# Patient Record
Sex: Female | Born: 1990 | Race: White | Hispanic: No | Marital: Married | Smoking: Current every day smoker
Health system: Southern US, Community
[De-identification: ages and names within clinical notes are randomized; demographics above are authoritative.]

## PROBLEM LIST (undated history)

## (undated) DIAGNOSIS — S060X9A Concussion with loss of consciousness of unspecified duration, initial encounter: Secondary | ICD-10-CM

## (undated) DIAGNOSIS — F329 Major depressive disorder, single episode, unspecified: Secondary | ICD-10-CM

## (undated) DIAGNOSIS — F32A Depression, unspecified: Secondary | ICD-10-CM

## (undated) DIAGNOSIS — K589 Irritable bowel syndrome without diarrhea: Secondary | ICD-10-CM

## (undated) DIAGNOSIS — J45909 Unspecified asthma, uncomplicated: Secondary | ICD-10-CM

## (undated) DIAGNOSIS — F319 Bipolar disorder, unspecified: Secondary | ICD-10-CM

## (undated) DIAGNOSIS — G43909 Migraine, unspecified, not intractable, without status migrainosus: Secondary | ICD-10-CM

## (undated) HISTORY — DX: Bipolar disorder, unspecified: F31.9

## (undated) HISTORY — PX: WISDOM TOOTH EXTRACTION: SHX21

## (undated) HISTORY — DX: Major depressive disorder, single episode, unspecified: F32.9

## (undated) HISTORY — DX: Unspecified asthma, uncomplicated: J45.909

## (undated) HISTORY — DX: Concussion with loss of consciousness of unspecified duration, initial encounter: S06.0X9A

## (undated) HISTORY — PX: TYMPANOSTOMY TUBE PLACEMENT: SHX32

## (undated) HISTORY — DX: Migraine, unspecified, not intractable, without status migrainosus: G43.909

## (undated) HISTORY — DX: Irritable bowel syndrome, unspecified: K58.9

## (undated) HISTORY — DX: Depression, unspecified: F32.A

## (undated) HISTORY — PX: APPENDECTOMY: SHX54

---
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
## 2010-09-08 ENCOUNTER — Inpatient Hospital Stay (INDEPENDENT_AMBULATORY_CARE_PROVIDER_SITE_OTHER)
Admission: RE | Admit: 2010-09-08 | Discharge: 2010-09-08 | Disposition: A | Discharge status: Home / Self Care | Payer: Worker's Compensation | Source: Ambulatory Visit | Attending: Emergency Medicine | Admitting: Emergency Medicine

## 2010-09-08 ENCOUNTER — Ambulatory Visit (INDEPENDENT_AMBULATORY_CARE_PROVIDER_SITE_OTHER): Payer: Worker's Compensation

## 2010-09-08 DIAGNOSIS — T148XXA Other injury of unspecified body region, initial encounter: Secondary | ICD-10-CM

## 2010-09-08 DIAGNOSIS — S6000XA Contusion of unspecified finger without damage to nail, initial encounter: Secondary | ICD-10-CM

## 2010-10-04 NOTE — Op Note (Signed)
[place]  Patient:    Emily Chavez, Emily Chavez Visit Number: [id number] MRN: [id number]          Service Type: PED Location: PEDS 6122 01 Attending Physician:  Leonia Corona Dictated by:   Judie Petit. Leonia Corona, M.D. Proc. Date: 08/03/01 Admit Date:  08/03/2001   CC:         [name], M.D.   Operative Report  PREOPERATIVE DIAGNOSIS:  Acute appendicitis, possible perforation.  POSTOPERATIVE DIAGNOSIS:  Acute perforated appendicitis.  OPERATION PERFORMED:  Open appendectomy and peritoneal lavage.  SURGEON:  Nelida Meuse, M.D.  ANESTHESIA:  General endotracheal.  ASSISTANT:  Nurse.  ESCRIPTION OF PROCEDURE:  The patient was brought to the operating room and placed supine on the operating table.  General endotracheal tube anesthesia was given.  The right lower quadrant of the abdominal wall and the surrounding area skin prepped and draped in the usual manner.  A McBurney incision centered at McBurneys point taken in curvilinear fashion along the skin crease measuring about 4 cm.  The incision was deepened through the subcutaneous tissues using the electrocautery until the external oblique aponeurosis was exposed which was incised in the line of its fibers and opened with scissors along the length of the incision.  The internal oblique muscle fibers were ____________ with a blunted hemostat and splayed with the help of blades of army-navy retractors.  The transverse abdominis muscles were also split along its fibers and split and stretched with blades of army-navy retractors.  The peritoneum was now visualized and held up with two hemostats and incised in between with scissors.  Immediately upon opening the peritoneum, serosanguinous fluid exuded which was suctioned out completely and then the peritoneal opening was increased with the help of scissors.  The blades of the retractor were inserted in the peritoneal cavity and  the underlying mass of bowel and omentum was clearly visible which was then felt with the right index finger.  The right index finger was slipped around the mass along the lateral wall of the abdomen where the mass was densely adheretn and upon blunt dissection we were able to separate the mass from the lateral wall where the appendix was stuck at the point of perforation.  A patch of gangrenous wall was visible around that mini perforation.  We continued blunt dissection with finger isolating the appendix which was coiled on itself and glued together with the terminal ileum and the cecal wall due to minute leak on that side.  After separating the tip of the appendix which was severely edematous and fragile, we continued dissecting the appendix toward the base.  This was a very long appendix with a gangrenous patch with microperforation in the center of the appendix.  The complete dissection of the appendix was done.  The edematous mesentery and the appendiceal branches of the vessel were ligated between two clamps and divided until the base of the appendix was cleared while the cecum was still in the peritoneal cavity. After complete dissection of the appendix, we were able to mobilize the cecal wall with the help of finger dissection and it was partly delivered into teh wound.  The base was then inspected carefully which was edematous and very wide.  It was crushed with a clamp and ligated using 2-0 Vicryl.  The base was ligated with 2-0 Vicryl and the appendix was clamped above the ligature and divided and removed from the field.  A pursestring suture in the cecal wall  around the appendiceal stump was made using 3-0 silk and the base was inverted by tightening the pursestring suture and ligating.  The appendix was returned back into the peritoneal cavity.  Local irrigation with a copious amount of warm saline was done until the returning fluid was completely clear.  The abdomen was now  closed in multiple layers.  The peritoneum was closed using 2-0 Vicryl running stitch.  The internal oblique and tranversus abdominis muscle were approximated in one layer using two interrupted sutures with 2-0 Vicryl.  The wound was once again irrigated and then skin was closed loosely using 4-0 nylon simple stitch.  Steri-Strips were applied which was covered with sterile gauze and Tegaderm dressing.  Approximately 10 cc of 0.25% Marcaine with epinephrine was infiltrated around the incisions for postoperative pain control.  The patient tolerated the procedure well which was smooth and uneventful.  The patient was later extubated and transported to the recovery room in good and stable condition. Dictated by:   Judie Petit. Leonia Corona, M.D. Attending Physician:  Leonia Corona DD:  08/04/01 TD:  08/05/01 Job: [id number]

## 2010-10-04 NOTE — Discharge Summary (Signed)
[place]  Patient:    Emily Chavez, Emily Chavez Visit Number: [id number] MRN: [id number]          Service Type: PED Location: PEDS [id number] Attending Physician:  Leonia Corona Dictated by:   Emelda Fear, M.D. Admit Date:  08/03/2001 Discharge Date: 08/06/2001                             Discharge Summary  DATE OF BIRTH:  Oct 15, 1990.  PROCEDURE:  Appendectomy performed by Dr. Leeanne Mannan.  CONSULTATIONS:  None.  DISCHARGE DIAGNOSES: 1. Gangrenous appendicitis with perforation. 2. Status post appendectomy.  DISCHARGE MEDICATIONS: 1. Augmentin 875 mg one tablet p.o. b.i.d. x10 days. 2. Tylenol, administer age-appropriate dose q.4h. p.r.n. pain.  FOLLOW-UP:  Family to call Dr. Leeanne Mannan for follow-up within the next seven to 10 days.  HOSPITAL COURSE:  Anaiza is a 20 year old Caucasian female presenting secondary to abdominal pain and vomiting for one day.  CT performed revealed inflammation surrounding the cecum, concerning for appendicitis.  Dr. Leeanne Mannan performed appendectomy on patient with a gangrenous appendicitis with perforation revealed during operation.  The patient was initiated on Unasyn and Clindamycin therapy postoperatively and did well throughout hospitalization.  The patient was ambulating well and tolerating p.o. without difficulty on day of discharge.  The patient remained afebrile throughout hospitalization.  The patient was discharged postop day #3 in stable condition.  DISCHARGE LABORATORY DATA:  White blood cells are 6.4, hemoglobin 11.5, hematocrit 33.7, platelets 236, neutrophils 49%, ANC of 3.1.  Sodium 139, potassium 3.4, chloride 104, bicarbonate 27, BUN 8, creatinine 0.5, glucose 96, and calcium of 9.1.  DISCHARGE INSTRUCTIONS:  Activity:  No heavy lifting or significant physical activity for the next four weeks.  Diet:  No restrictions.  Wound care:  Per recommendations of Dr. Leeanne Mannan.  SPECIAL  INSTRUCTIONS:  The patient is recommended to call Dr. Rosalee Kaufman office for the development of fever, vomiting, or severe abdominal pain in the upcoming week. Dictated by:   Emelda Fear, M.D. Attending Physician:  Leonia Corona DD:  08/06/01 TD:  08/08/01 Job: [id number]

## 2011-02-04 DIAGNOSIS — F3175 Bipolar disorder, in partial remission, most recent episode depressed: Secondary | ICD-10-CM | POA: Insufficient documentation

## 2011-02-17 LAB — RAPID URINE DRUG SCREEN, HOSP PERFORMED
Amphetamines: NOT DETECTED
Barbiturates: NOT DETECTED
Benzodiazepines: NOT DETECTED
Cocaine: NOT DETECTED
Opiates: NOT DETECTED
Tetrahydrocannabinol: NOT DETECTED

## 2011-02-17 LAB — URINALYSIS, ROUTINE W REFLEX MICROSCOPIC
Bilirubin Urine: NEGATIVE
Glucose, UA: NEGATIVE
Hgb urine dipstick: NEGATIVE
Ketones, ur: NEGATIVE
Nitrite: NEGATIVE
Protein, ur: NEGATIVE
Specific Gravity, Urine: 1.018
Urobilinogen, UA: 0.2
pH: 7.5

## 2011-02-17 LAB — ETHANOL: Alcohol, Ethyl (B): <5

## 2011-02-17 LAB — POCT I-STAT, CHEM 8
BUN: 4 — ABNORMAL LOW
BUN: 7
Calcium, Ion: 0.79 — ABNORMAL LOW
Calcium, Ion: 1.12
Chloride: 107
Chloride: 115 — ABNORMAL HIGH
Creatinine, Ser: 0.7
Creatinine, Ser: 0.9
Glucose, Bld: 84
Glucose, Bld: 91
HCT: 38
HCT: 39
Hemoglobin: 12.9
Hemoglobin: 13.3
Potassium: 3.3 — ABNORMAL LOW
Potassium: 3.6
Sodium: 136
Sodium: 141
TCO2: 18
TCO2: 24

## 2011-02-17 LAB — PREGNANCY, URINE
Preg Test, Ur: NEGATIVE
Preg Test, Ur: NEGATIVE

## 2012-09-01 ENCOUNTER — Telehealth: Payer: Self-pay | Admitting: Obstetrics & Gynecology

## 2012-09-01 NOTE — Telephone Encounter (Signed)
Pt calling to see when the last time her blood sugar was checked was?/[place]

## 2012-09-01 NOTE — Telephone Encounter (Signed)
Patient notified that blood sugar has not been checked here.  Not a routine screening test at her age. Patient reports she learned in class that urinary frequency can be a sign of diabetes.  She reports urinary frequency for approx 1 year and 50 pound weight gain.  Denies any pain with urination.  Advised that we would recommend she be seen for evaluation and doctor to determine what lab work she may need.  Brief discussion that there are other ways to check blood sugar, HgbA1C and that may also need urine recheck.  Patient declines appointment, just wanted blood work.  States she has PCP appointment on Tuesday and will go there first and call back if needed.

## 2012-09-16 DIAGNOSIS — S060XAA Concussion with loss of consciousness status unknown, initial encounter: Secondary | ICD-10-CM

## 2012-09-16 DIAGNOSIS — S060X9A Concussion with loss of consciousness of unspecified duration, initial encounter: Secondary | ICD-10-CM

## 2012-09-16 HISTORY — DX: Concussion with loss of consciousness of unspecified duration, initial encounter: S06.0X9A

## 2012-09-16 HISTORY — DX: Concussion with loss of consciousness status unknown, initial encounter: S06.0XAA

## 2012-10-12 ENCOUNTER — Ambulatory Visit: Payer: Self-pay

## 2012-10-13 ENCOUNTER — Telehealth: Payer: Self-pay | Admitting: Gynecology

## 2012-10-13 NOTE — Telephone Encounter (Signed)
Pt would like to r/s depo appt from yesterday but not sure if she is still within her time frame to come in next week.

## 2012-10-14 ENCOUNTER — Ambulatory Visit (INDEPENDENT_AMBULATORY_CARE_PROVIDER_SITE_OTHER): Payer: Commercial Managed Care - PPO | Admitting: Obstetrics & Gynecology

## 2012-10-14 VITALS — BP 110/68 | Wt 200.0 lb

## 2012-10-14 DIAGNOSIS — Z3040 Encounter for surveillance of contraceptives, unspecified: Secondary | ICD-10-CM

## 2012-10-14 MED ORDER — MEDROXYPROGESTERONE ACETATE 150 MG/ML IM SUSP
150.0000 mg | Freq: Once | INTRAMUSCULAR | Status: AC
  Administered 2012-10-14: 150 mg via INTRAMUSCULAR

## 2012-10-14 NOTE — Telephone Encounter (Signed)
Spoke with pt who had last depo shot 07-26-12. Next shot due 5-26 thru 6-9. Pt requesting to come today because she has a ride. Appt sched for 1:00 today.

## 2012-10-14 NOTE — Progress Notes (Signed)
Patient tolerated injection of 150 mg Depo-Provera IM well knows to return in 3 months for follow up injection. 01/06/13

## 2013-01-03 ENCOUNTER — Emergency Department (HOSPITAL_COMMUNITY): Payer: Commercial Managed Care - PPO

## 2013-01-03 ENCOUNTER — Ambulatory Visit (INDEPENDENT_AMBULATORY_CARE_PROVIDER_SITE_OTHER): Payer: Commercial Managed Care - PPO | Admitting: *Deleted

## 2013-01-03 ENCOUNTER — Emergency Department (HOSPITAL_COMMUNITY)
Admission: EM | Admit: 2013-01-03 | Discharge: 2013-01-03 | Disposition: A | Discharge status: Home / Self Care | Payer: Commercial Managed Care - PPO | Attending: Emergency Medicine | Admitting: Emergency Medicine

## 2013-01-03 ENCOUNTER — Encounter (HOSPITAL_COMMUNITY): Payer: Self-pay | Admitting: Emergency Medicine

## 2013-01-03 VITALS — BP 110/68 | HR 82 | Resp 16 | Ht 64.0 in | Wt 191.0 lb

## 2013-01-03 DIAGNOSIS — X500XXA Overexertion from strenuous movement or load, initial encounter: Secondary | ICD-10-CM | POA: Insufficient documentation

## 2013-01-03 DIAGNOSIS — S298XXA Other specified injuries of thorax, initial encounter: Secondary | ICD-10-CM | POA: Insufficient documentation

## 2013-01-03 DIAGNOSIS — Y9389 Activity, other specified: Secondary | ICD-10-CM | POA: Insufficient documentation

## 2013-01-03 DIAGNOSIS — Y929 Unspecified place or not applicable: Secondary | ICD-10-CM | POA: Insufficient documentation

## 2013-01-03 DIAGNOSIS — R0789 Other chest pain: Secondary | ICD-10-CM

## 2013-01-03 DIAGNOSIS — Z79899 Other long term (current) drug therapy: Secondary | ICD-10-CM | POA: Insufficient documentation

## 2013-01-03 DIAGNOSIS — Z3040 Encounter for surveillance of contraceptives, unspecified: Secondary | ICD-10-CM

## 2013-01-03 MED ORDER — IBUPROFEN 800 MG PO TABS: 800.0000 mg | ORAL_TABLET | Freq: Three times a day (TID) | ORAL | Status: DC

## 2013-01-03 MED ORDER — MEDROXYPROGESTERONE ACETATE 150 MG/ML IM SUSP
150.0000 mg | Freq: Once | INTRAMUSCULAR | Status: AC
  Administered 2013-01-03: 150 mg via INTRAMUSCULAR

## 2013-01-03 NOTE — ED Provider Notes (Signed)
CSN: [id number]     Arrival date & time 01/03/13  2140 History  This chart was scribed for non-physician practitioner Magnus Sinning, PA-C, working with Joya Gaskins, MD by Shari Heritage, ED Scribe. This patient was seen in room TR11C/TR11C and the patient's care was started at 11:10 PM.    Chief Complaint  Patient presents with  . Chest Pain    The history is provided by the patient. No language interpreter was used.   HPI Comments: Emily Chavez is a 22 y.o. female who presents to the Emergency Department complaining of moderate, sternal chest pain that began this afternoon. She reports she was putting her hair back into a ponytail when she heard a loud pop and began experiencing pain. She reports pain is exacerbated by movement and deep breaths. She has not taken anything for pain prior to arrival.  She denies SOB. She has no pertinent medical history. She is a non-smoker.  History reviewed. No pertinent past medical history. History reviewed. No pertinent past surgical history. No family history on file. History  Substance Use Topics  . Smoking status: Never Smoker   . Smokeless tobacco: Not on file  . Alcohol Use: Yes   OB History   Grav Para Term Preterm Abortions TAB SAB Ect Mult Living                 Review of Systems A complete 10 system review of systems was obtained and all systems are negative except as noted in the HPI and PMH.   Allergies  Iodine  Home Medications   Current Outpatient Rx  Name  Route  Sig  Dispense  Refill  . Biotin 5000 MCG CAPS   Oral   Take 5,000 mcg by mouth daily.         . divalproex (DEPAKOTE) 500 MG DR tablet   Oral   Take 500-1,000 mg by mouth daily. Take 500mg  one day and 1,000 the next day.         . MedroxyPROGESTERone Acetate (DEPO-PROVERA IM)   Intramuscular   Inject 1 Applicatorful into the muscle every 3 (three) months.         . Soft Lens Products (VISINE FOR CONTACTS) SOLN   Both Eyes   Place 1-2 drops  into both eyes as needed (dry eyes).          Triage Vitals: BP 133/79  Pulse 104  Temp(Src) 98.4 F (36.9 C) (Oral)  Resp 18  SpO2 99%  Physical Exam  Nursing note and vitals reviewed. Constitutional: She is oriented to person, place, and time. She appears well-developed and well-nourished.  HENT:  Head: Normocephalic and atraumatic.  Eyes: EOM are normal.  Neck: Neck supple.  Cardiovascular: Normal rate, regular rhythm and normal heart sounds.   Pulmonary/Chest: Effort normal and breath sounds normal. No respiratory distress. She has no wheezes. She has no rales. She exhibits tenderness.  Musculoskeletal:  Tenderness to palpation of the sternum and the left anterior chest. Chest pain becomes worse with movement of the left arm. 2+ radial pulses bilaterally.   Neurological: She is alert and oriented to person, place, and time.  Skin: Skin is warm and dry.  Psychiatric: She has a normal mood and affect.    ED Course  DIAGNOSTIC STUDIES: Oxygen Saturation is 99% on room air, normal by my interpretation.    COORDINATION OF CARE: 11:18 PM- Patient presents to the ED complaining of chest pain. Discussed pain symptoms are likely  due to irritation of the costal cartilage. Will prescribe ibuprofen and advised patient to apply ice to treat swelling. Patient informed of current plan for treatment and evaluation and agrees with plan at this time.     Procedures (including critical care time)  Labs Reviewed - No data to display Dg Chest 2 View  01/03/2013   *RADIOLOGY REPORT*  Clinical Data: Chest pain and chest pressure.  CHEST - 2 VIEW  Comparison: None.  Findings: Slightly shallow inspiration. The heart size and pulmonary vascularity are normal. The lungs appear clear and expanded without focal air space disease or consolidation. No blunting of the costophrenic angles.  No pneumothorax.  Mediastinal contours appear intact.  IMPRESSION: No evidence of active pulmonary disease.    Original Report Authenticated By: Burman Nieves, M.D.   No diagnosis found.  MDM  Patient presenting with chest wall pain after feeling a pop in her chest while doing her hair.  CXR negative.  Patient stable for discharge.  Patient instructed to take NSAIDs.    I personally performed the services described in this documentation, which was scribed in my presence. The recorded information has been reviewed and is accurate.    Pascal Lux [place], PA-C 01/03/13 2348

## 2013-01-03 NOTE — Progress Notes (Signed)
Pt. Tolerated injection well is aware to return in 3 months for Next Depo

## 2013-01-03 NOTE — ED Notes (Signed)
PT. REPORTS PAIN AT MID CHEST WHEN MOVING HER ARMS AND TAKING A DEEP BREATH ONSET THIS EVENING WHILE FIXING HER HAIR AND ARCHED HER BACK " FELT A POP" , RESPIRATIONS UNLABORED .

## 2013-01-05 NOTE — ED Provider Notes (Signed)
Medical screening examination/treatment/procedure(s) were performed by non-physician practitioner and as supervising physician I was immediately available for consultation/collaboration.   Joya Gaskins, MD 01/05/13 1022

## 2013-03-22 ENCOUNTER — Ambulatory Visit: Payer: Commercial Managed Care - PPO

## 2013-03-25 ENCOUNTER — Ambulatory Visit (INDEPENDENT_AMBULATORY_CARE_PROVIDER_SITE_OTHER): Payer: Commercial Managed Care - PPO | Admitting: Gynecology

## 2013-03-25 ENCOUNTER — Encounter: Payer: Self-pay | Admitting: *Deleted

## 2013-03-25 VITALS — BP 100/68 | HR 72 | Resp 16 | Wt 205.0 lb

## 2013-03-25 DIAGNOSIS — Z3009 Encounter for other general counseling and advice on contraception: Secondary | ICD-10-CM

## 2013-03-25 DIAGNOSIS — Z3040 Encounter for surveillance of contraceptives, unspecified: Secondary | ICD-10-CM

## 2013-03-25 DIAGNOSIS — N644 Mastodynia: Secondary | ICD-10-CM

## 2013-03-25 LAB — POCT URINE PREGNANCY: Preg Test, Ur: NEGATIVE

## 2013-03-25 MED ORDER — MEDROXYPROGESTERONE ACETATE 150 MG/ML IM SUSP
150.0000 mg | Freq: Once | INTRAMUSCULAR | Status: AC
  Administered 2013-03-25: 150 mg via INTRAMUSCULAR

## 2013-03-25 NOTE — Addendum Note (Signed)
Addended by: Douglass Rivers on: 03/25/2013 11:59 AM   Modules accepted: Orders, Level of Service

## 2013-03-25 NOTE — Addendum Note (Signed)
Addended by: Lorraine Lax on: 03/25/2013 11:44 AM   Modules accepted: Orders, Medications

## 2013-03-25 NOTE — Patient Instructions (Signed)
Return for either repeat depo provera or iud by 04/04/13  Levonorgestrel intrauterine device (IUD) What is this medicine? LEVONORGESTREL IUD (LEE voe nor jes trel) is a contraceptive (birth control) device. The device is placed inside the uterus by a healthcare professional. It is used to prevent pregnancy and can also be used to treat heavy bleeding that occurs during your period. Depending on the device, it can be used for 3 to 5 years. This medicine may be used for other purposes; ask your health care provider or pharmacist if you have questions. COMMON BRAND NAME(S): Gretta Cool What should I tell my health care provider before I take this medicine? They need to know if you have any of these conditions: -abnormal Pap smear -cancer of the breast, uterus, or cervix -diabetes -endometritis -genital or pelvic infection now or in the past -have more than one sexual partner or your partner has more than one partner -heart disease -history of an ectopic or tubal pregnancy -immune system problems -IUD in place -liver disease or tumor -problems with blood clots or take blood-thinners -use intravenous drugs -uterus of unusual shape -vaginal bleeding that has not been explained -an unusual or allergic reaction to levonorgestrel, other hormones, silicone, or polyethylene, medicines, foods, dyes, or preservatives -pregnant or trying to get pregnant -breast-feeding How should I use this medicine? This device is placed inside the uterus by a health care professional. Talk to your pediatrician regarding the use of this medicine in children. Special care may be needed. Overdosage: If you think you have taken too much of this medicine contact a poison control center or emergency room at once. NOTE: This medicine is only for you. Do not share this medicine with others. What if I miss a dose? This does not apply. What may interact with this medicine? Do not take this medicine with any of the  following medications: -amprenavir -bosentan -fosamprenavir This medicine may also interact with the following medications: -aprepitant -barbiturate medicines for inducing sleep or treating seizures -bexarotene -griseofulvin -medicines to treat seizures like carbamazepine, ethotoin, felbamate, oxcarbazepine, phenytoin, topiramate -modafinil -pioglitazone -rifabutin -rifampin -rifapentine -some medicines to treat HIV infection like atazanavir, indinavir, lopinavir, nelfinavir, tipranavir, ritonavir -St. John's wort -warfarin This list may not describe all possible interactions. Give your health care provider a list of all the medicines, herbs, non-prescription drugs, or dietary supplements you use. Also tell them if you smoke, drink alcohol, or use illegal drugs. Some items may interact with your medicine. What should I watch for while using this medicine? Visit your doctor or health care professional for regular check ups. See your doctor if you or your partner has sexual contact with others, becomes HIV positive, or gets a sexual transmitted disease. This product does not protect you against HIV infection (AIDS) or other sexually transmitted diseases. You can check the placement of the IUD yourself by reaching up to the top of your vagina with clean fingers to feel the threads. Do not pull on the threads. It is a good habit to check placement after each menstrual period. Call your doctor right away if you feel more of the IUD than just the threads or if you cannot feel the threads at all. The IUD may come out by itself. You may become pregnant if the device comes out. If you notice that the IUD has come out use a backup birth control method like condoms and call your health care provider. Using tampons will not change the position of the IUD and are  okay to use during your period. What side effects may I notice from receiving this medicine? Side effects that you should report to your  doctor or health care professional as soon as possible: -allergic reactions like skin rash, itching or hives, swelling of the face, lips, or tongue -fever, flu-like symptoms -genital sores -high blood pressure -no menstrual period for 6 weeks during use -pain, swelling, warmth in the leg -pelvic pain or tenderness -severe or sudden headache -signs of pregnancy -stomach cramping -sudden shortness of breath -trouble with balance, talking, or walking -unusual vaginal bleeding, discharge -yellowing of the eyes or skin Side effects that usually do not require medical attention (report to your doctor or health care professional if they continue or are bothersome): -acne -breast pain -change in sex drive or performance -changes in weight -cramping, dizziness, or faintness while the device is being inserted -headache -irregular menstrual bleeding within first 3 to 6 months of use -nausea This list may not describe all possible side effects. Call your doctor for medical advice about side effects. You may report side effects to FDA at 1-800-FDA-1088. Where should I keep my medicine? This does not apply. NOTE: This sheet is a summary. It may not cover all possible information. If you have questions about this medicine, talk to your doctor, pharmacist, or health care provider.  2014, Elsevier/Gold Standard. (2011-06-05 13:54:04)

## 2013-03-25 NOTE — Progress Notes (Signed)
Subjective:     Patient ID: Emily Chavez, female   DOB: [birth year], 22 y.o.   MRN: [id number]  HPI Comments: Pt here for Depo injection, on for over 1y, now with no cycle except some occasional spotting .  Pt concerned because she took an antibiotic a few weeks ago for a sinus infection and was concerned that it would interfere with her contraception.  Pt reports some breast tenderness but she can get from time to time. Pt reports emotional on depo.  Pt was on ocp in past, but was noncompliant.  No condoms, current partner almost 2y. Pt also has concerns that she might PCOS.  She reports urinary frequency and was screened for diabetes but was negative, she also expresses concerns regarding future fertility and bone loss on depo    Review of Systems  Constitutional: Negative for fever and chills.  Gastrointestinal: Negative for abdominal pain.  Genitourinary: Positive for vaginal discharge (light, unchaged). Negative for vaginal bleeding and vaginal pain.       Objective:   Physical Exam  Constitutional: She is oriented to person, place, and time. She appears well-developed and well-nourished.  Genitourinary: Vagina normal and uterus normal. There is no rash or tenderness on the right labia. There is no rash or tenderness on the left labia. Uterus is not tender. Cervix exhibits no motion tenderness, no discharge and no friability. Right adnexum displays no mass. Left adnexum displays no mass.  Neurological: She is alert and oriented to person, place, and time.  Skin: Skin is warm and dry.       Assessment:     Contraceptive management     Plan:     Long discussion regarding mechanism of action of depo-provera and the side effects from the medication such as depression and weight gain as well as delay in full fertility but not infertility.  We discussed affects on bone density but also the return of bone mass after discontinuance We also reviewed alternatives such as skyla IUD and  nexplanon which are different progestins and may have a different affect on her.  Christean Grief shown to pt, she would like to check on insurance coverage before proceeding, in the meanwhile, she is still  Covered by the depo and injection was delayed today awaiting her final decision,  She is instructed to return for either by 11/17 Info on iud provided We also discussed PCOS and stressed that she does not have that diagnosis based on urinary frequency and testing for diabetes. Vaginal discharge consistent with progestin affect Questions were addressed 76m spent discussing above

## 2013-03-25 NOTE — Addendum Note (Signed)
Addended by: Ernest Haber on: 03/25/2013 10:58 AM   Modules accepted: Orders

## 2013-03-28 ENCOUNTER — Telehealth: Payer: Self-pay | Admitting: Gynecology

## 2013-03-28 NOTE — Telephone Encounter (Signed)
Pt calling to get insurance information for the skyla insertion. Says she would have to come in this week or get the depo shot.

## 2013-03-29 ENCOUNTER — Encounter: Payer: Self-pay | Admitting: Gynecology

## 2013-03-29 DIAGNOSIS — F319 Bipolar disorder, unspecified: Secondary | ICD-10-CM | POA: Insufficient documentation

## 2013-03-29 MED ORDER — MISOPROSTOL 200 MCG PO TABS: ORAL_TABLET | ORAL | Status: DC

## 2013-03-29 NOTE — Telephone Encounter (Signed)
Patient is scheduled for [facility] 11/12.   Take Cytotec 200 mcg tablet.  1 tablet night before the procedure.  1 tablet the morning of the procedure.  800 mg (Can purchase over the counter, you will need four 200 mg pills) 1 hour before appointment.  Take with food.  Be sure to eat and drink before appointment. Patient is agreeable and verbalizes understanding of pre-procedure instructions.

## 2013-03-30 ENCOUNTER — Ambulatory Visit (INDEPENDENT_AMBULATORY_CARE_PROVIDER_SITE_OTHER): Payer: Commercial Managed Care - PPO | Admitting: Gynecology

## 2013-03-30 VITALS — BP 118/80 | Resp 18 | Ht 64.0 in | Wt 201.0 lb

## 2013-03-30 DIAGNOSIS — Z30430 Encounter for insertion of intrauterine contraceptive device: Secondary | ICD-10-CM

## 2013-03-30 DIAGNOSIS — Z3040 Encounter for surveillance of contraceptives, unspecified: Secondary | ICD-10-CM

## 2013-03-30 HISTORY — PX: OTHER SURGICAL HISTORY: SHX169

## 2013-03-30 NOTE — Patient Instructions (Signed)
IUD PLACEMENT POST PROCEDURE INSTRUCTIONS  1. You may take Ibuprofen, Aleve or Tylenol for pain as needed.      Cramping should resolve within 24 hours.  2. You may have a small amount of spotting. You should wear a mini pad for the next few days  3. You may have intercourse after 24 hours. If you are using this for birth control, it is effective immediately.  4. You need to call if you have any pelvic pain, fever, heavy bleeding or foul smelling vaginal discharge. Irregular bleeding is common the first several months after having an IUD placed. You do not need to for this reason unless you are                     concerned.   5. Shower or bathe as normal  6. You should have a follow-up appointment in 4-8 weeks for a re-check to make sure you are not having any problems.  

## 2013-03-30 NOTE — Progress Notes (Signed)
22 yrs Single Caucasian female presents for  insertion of skyla. Denies any vaginal symptoms or STD concerns.  Pt is amenorrheic since being on DepoProvera, now transitioning to IUD.      Patient read information regarding IUD insertion.  All questions addressed.    Healthy female,time, place and personnormal menses, no abnormal bleeding, pelvic pain or discharge Abdomen: soft, non-tender Groinno inguinal nodes palpated  Pelvic exam: Vulva;normal  Vagina:normal vagina  Cervix:Non-tender, Negative CMT, no lesions or redness, nulliparous/parous os  Uterus:normal shape, position and consistency    Procedure:  Bimanual exam performed for orientation of uterus. Speculum inserted into vagina. Cervix visualized and cleansed with hibiclens solution X 3, xylocaine jelly placed.. Tenaculum placed on cervix at 12 o'clock position(s).  Uterus sounded to 8 centimeters.  IUD removed from sterile packet and under sterile conditions inserted to fundus of uterus.  Introducer removed without difficulty.  IUD string trimmed to 3 centimeters.  Remainder string given to patient to feel for identification.  Tenaculum removed.  No bleeding noted.  Speculum removed.  Uterus palpated normal.  Patient tolerated procedure well.   Pt requests paracervical block for cramping afterwards-10cc of 2%lidocaine/m0.25%marcaine placed   A: Insertion of Lot # [id number], Expiration date 1/17, Skyla   P:  Instructions and warnings signs given.       IUD identification card given with IUD removal 02/2016       Return visit 3m

## 2013-04-20 ENCOUNTER — Ambulatory Visit: Payer: Self-pay | Admitting: Obstetrics & Gynecology

## 2013-04-22 ENCOUNTER — Ambulatory Visit: Payer: Self-pay | Admitting: Obstetrics & Gynecology

## 2013-05-02 ENCOUNTER — Ambulatory Visit: Payer: Self-pay | Admitting: Gynecology

## 2013-05-05 ENCOUNTER — Encounter: Payer: Self-pay | Admitting: Certified Nurse Midwife

## 2013-05-05 ENCOUNTER — Ambulatory Visit (INDEPENDENT_AMBULATORY_CARE_PROVIDER_SITE_OTHER): Payer: Commercial Managed Care - PPO | Admitting: Certified Nurse Midwife

## 2013-05-05 VITALS — BP 102/64 | HR 68 | Resp 16 | Ht 63.75 in | Wt 209.0 lb

## 2013-05-05 DIAGNOSIS — Z0000 Encounter for general adult medical examination without abnormal findings: Secondary | ICD-10-CM

## 2013-05-05 DIAGNOSIS — R339 Retention of urine, unspecified: Secondary | ICD-10-CM

## 2013-05-05 DIAGNOSIS — Z01419 Encounter for gynecological examination (general) (routine) without abnormal findings: Secondary | ICD-10-CM

## 2013-05-05 LAB — POCT URINALYSIS DIPSTICK
Bilirubin, UA: NEGATIVE
Glucose, UA: NEGATIVE
Leukocytes, UA: NEGATIVE
Nitrite, UA: NEGATIVE
Protein, UA: NEGATIVE
Urobilinogen, UA: NEGATIVE
pH, UA: 5

## 2013-05-05 NOTE — Patient Instructions (Signed)
General topics  Next pap or exam is  due in 1 year Take a Women's multivitamin Take 1200 mg. of calcium daily - prefer dietary If any concerns in interim to call back  Breast Self-Awareness Practicing breast self-awareness may pick up problems early, prevent significant medical complications, and possibly save your life. By practicing breast self-awareness, you can become familiar with how your breasts look and feel and if your breasts are changing. This allows you to notice changes early. It can also offer you some reassurance that your breast health is good. One way to learn what is normal for your breasts and whether your breasts are changing is to do a breast self-exam. If you find a lump or something that was not present in the past, it is best to contact your caregiver right away. Other findings that should be evaluated by your caregiver include nipple discharge, especially if it is bloody; skin changes or reddening; areas where the skin seems to be pulled in (retracted); or new lumps and bumps. Breast pain is seldom associated with cancer (malignancy), but should also be evaluated by a caregiver. BREAST SELF-EXAM The best time to examine your breasts is 5 7 days after your menstrual period is over.  ExitCare Patient Information 2013 [employer].   Exercise to Stay Healthy Exercise helps you become and stay healthy. EXERCISE IDEAS AND TIPS Choose exercises that:  You enjoy.  Fit into your day. You do not need to exercise really hard to be healthy. You can do exercises at a slow or medium level and stay healthy. You can:  Stretch before and after working out.  Try yoga, Pilates, or tai chi.  Lift weights.  Walk fast, swim, jog, run, climb stairs, bicycle, dance, or rollerskate.  Take aerobic classes. Exercises that burn about 150 calories:  Running 1  miles in 15 minutes.  Playing volleyball for 45 to 60 minutes.  Washing and waxing a car for 45 to 60  minutes.  Playing touch football for 45 minutes.  Walking 1  miles in 35 minutes.  Pushing a stroller 1  miles in 30 minutes.  Playing basketball for 30 minutes.  Raking leaves for 30 minutes.  Bicycling 5 miles in 30 minutes.  Walking 2 miles in 30 minutes.  Dancing for 30 minutes.  Shoveling snow for 15 minutes.  Swimming laps for 20 minutes.  Walking up stairs for 15 minutes.  Bicycling 4 miles in 15 minutes.  Gardening for 30 to 45 minutes.  Jumping rope for 15 minutes.  Washing windows or floors for 45 to 60 minutes. Document Released: 06/07/2010 Document Revised: 07/28/2011 Document Reviewed: 06/07/2010 ExitCare Patient Information 2013 [employer].   Other topics ( that may be useful information):    Sexually Transmitted Disease Sexually transmitted disease (STD) refers to any infection that is passed from person to person during sexual activity. This may happen by way of saliva, semen, blood, vaginal mucus, or urine. Common STDs include:  Gonorrhea.  Chlamydia.  Syphilis.  HIV/AIDS.  Genital herpes.  Hepatitis B and C.  Trichomonas.  Human papillomavirus (HPV).  Pubic lice. CAUSES  An STD may be spread by bacteria, virus, or parasite. A person can get an STD by:  Sexual intercourse with an infected person.  Sharing sex toys with an infected person.  Sharing needles with an infected person.  Having intimate contact with the genitals, mouth, or rectal areas of an infected person. SYMPTOMS  Some people may not have any symptoms, but   they can still pass the infection to others. Different STDs have different symptoms. Symptoms include:  Painful or bloody urination.  Pain in the pelvis, abdomen, vagina, anus, throat, or eyes.  Skin rash, itching, irritation, growths, or sores (lesions). These usually occur in the genital or anal area.  Abnormal vaginal discharge.  Penile discharge in men.  Soft, flesh-colored skin growths in the  genital or anal area.  Fever.  Pain or bleeding during sexual intercourse.  Swollen glands in the groin area.  Yellow skin and eyes (jaundice). This is seen with hepatitis. DIAGNOSIS  To make a diagnosis, your caregiver may:  Take a medical history.  Perform a physical exam.  Take a specimen (culture) to be examined.  Examine a sample of discharge under a microscope.  Perform blood test TREATMENT   Chlamydia, gonorrhea, trichomonas, and syphilis can be cured with antibiotic medicine.  Genital herpes, hepatitis, and HIV can be treated, but not cured, with prescribed medicines. The medicines will lessen the symptoms.  Genital warts from HPV can be treated with medicine or by freezing, burning (electrocautery), or surgery. Warts may come back.  HPV is a virus and cannot be cured with medicine or surgery.However, abnormal areas may be followed very closely by your caregiver and may be removed from the cervix, vagina, or vulva through office procedures or surgery. If your diagnosis is confirmed, your recent sexual partners need treatment. This is true even if they are symptom-free or have a negative culture or evaluation. They should not have sex until their caregiver says it is okay. HOME CARE INSTRUCTIONS  All sexual partners should be informed, tested, and treated for all STDs.  Take your antibiotics as directed. Finish them even if you start to feel better.  Only take over-the-counter or prescription medicines for pain, discomfort, or fever as directed by your caregiver.  Rest.  Eat a balanced diet and drink enough fluids to keep your urine clear or pale yellow.  Do not have sex until treatment is completed and you have followed up with your caregiver. STDs should be checked after treatment.  Keep all follow-up appointments, Pap tests, and blood tests as directed by your caregiver.  Only use latex condoms and water-soluble lubricants during sexual activity. Do not use  petroleum jelly or oils.  Avoid alcohol and illegal drugs.  Get vaccinated for HPV and hepatitis. If you have not received these vaccines in the past, talk to your caregiver about whether one or both might be right for you.  Avoid risky sex practices that can break the skin. The only way to avoid getting an STD is to avoid all sexual activity.Latex condoms and dental dams (for oral sex) will help lessen the risk of getting an STD, but will not completely eliminate the risk. SEEK MEDICAL CARE IF:   You have a fever.  You have any new or worsening symptoms. Document Released: 07/26/2002 Document Revised: 07/28/2011 Document Reviewed: 08/02/2010 ExitCare Patient Information 2013 [employer].    Domestic Abuse You are being battered or abused if someone close to you hits, pushes, or physically hurts you in any way. You also are being abused if you are forced into activities. You are being sexually abused if you are forced to have sexual contact of any kind. You are being emotionally abused if you are made to feel worthless or if you are constantly threatened. It is important to remember that help is available. No one has the right to abuse you. PREVENTION OF FURTHER   ABUSE  Learn the warning signs of danger. This varies with situations but may include: the use of alcohol, threats, isolation from friends and family, or forced sexual contact. Leave if you feel that violence is going to occur.  If you are attacked or beaten, report it to the police so the abuse is documented. You do not have to press charges. The police can protect you while you or the attackers are leaving. Get the officer's name and badge number and a copy of the report.  Find someone you can trust and tell them what is happening to you: your caregiver, a nurse, clergy member, close friend or family member. Feeling ashamed is natural, but remember that you have done nothing wrong. No one deserves abuse. Document Released:  05/02/2000 Document Revised: 07/28/2011 Document Reviewed: 07/11/2010 ExitCare Patient Information 2013 [employer].    How Much is Too Much Alcohol? Drinking too much alcohol can cause injury, accidents, and health problems. These types of problems can include:   Car crashes.  Falls.  Family fighting (domestic violence).  Drowning.  Fights.  Injuries.  Burns.  Damage to certain organs.  Having a baby with birth defects. ONE DRINK CAN BE TOO MUCH WHEN YOU ARE:  Working.  Pregnant or breastfeeding.  Taking medicines. Ask your doctor.  Driving or planning to drive. If you or someone you know has a drinking problem, get help from a doctor.  Document Released: 03/01/2009 Document Revised: 07/28/2011 Document Reviewed: 03/01/2009 ExitCare Patient Information 2013 [employer].   Smoking Hazards Smoking cigarettes is extremely bad for your health. Tobacco smoke has over 200 known poisons in it. There are over 60 chemicals in tobacco smoke that cause cancer. Some of the chemicals found in cigarette smoke include:   Cyanide.  Benzene.  Formaldehyde.  Methanol (wood alcohol).  Acetylene (fuel used in welding torches).  Ammonia. Cigarette smoke also contains the poisonous gases nitrogen oxide and carbon monoxide.  Cigarette smokers have an increased risk of many serious medical problems and Smoking causes approximately:  90% of all lung cancer deaths in men.  80% of all lung cancer deaths in women.  90% of deaths from chronic obstructive lung disease. Compared with nonsmokers, smoking increases the risk of:  Coronary heart disease by 2 to 4 times.  Stroke by 2 to 4 times.  Men developing lung cancer by 23 times.  Women developing lung cancer by 13 times.  Dying from chronic obstructive lung diseases by 12 times.  . Smoking is the most preventable cause of death and disease in our society.  WHY IS SMOKING ADDICTIVE?  Nicotine is the chemical  agent in tobacco that is capable of causing addiction or dependence.  When you smoke and inhale, nicotine is absorbed rapidly into the bloodstream through your lungs. Nicotine absorbed through the lungs is capable of creating a powerful addiction. Both inhaled and non-inhaled nicotine may be addictive.  Addiction studies of cigarettes and spit tobacco show that addiction to nicotine occurs mainly during the teen years, when young people begin using tobacco products. WHAT ARE THE BENEFITS OF QUITTING?  There are many health benefits to quitting smoking.   Likelihood of developing cancer and heart disease decreases. Health improvements are seen almost immediately.  Blood pressure, pulse rate, and breathing patterns start returning to normal soon after quitting. QUITTING SMOKING   American Lung Association - 1-800-LUNGUSA  American Cancer Society - 1-800-ACS-2345 Document Released: 06/12/2004 Document Revised: 07/28/2011 Document Reviewed: 02/14/2009 ExitCare Patient Information 2013 [employer].   Stress Management Stress is a state of physical or mental tension that often results from changes in your life or normal routine. Some common causes of stress are:  Death of a loved one.  Injuries or severe illnesses.  Getting fired or changing jobs.  Moving into a new home. Other causes may be:  Sexual problems.  Business or financial losses.  Taking on a large debt.  Regular conflict with someone at home or at work.  Constant tiredness from lack of sleep. It is not just bad things that are stressful. It may be stressful to:  Win the lottery.  Get married.  Buy a new car. The amount of stress that can be easily tolerated varies from person to person. Changes generally cause stress, regardless of the types of change. Too much stress can affect your health. It may lead to physical or emotional problems. Too little stress (boredom) may also become stressful. SUGGESTIONS TO  REDUCE STRESS:  Talk things over with your family and friends. It often is helpful to share your concerns and worries. If you feel your problem is serious, you may want to get help from a professional counselor.  Consider your problems one at a time instead of lumping them all together. Trying to take care of everything at once may seem impossible. List all the things you need to do and then start with the most important one. Set a goal to accomplish 2 or 3 things each day. If you expect to do too many in a single day you will naturally fail, causing you to feel even more stressed.  Do not use alcohol or drugs to relieve stress. Although you may feel better for a short time, they do not remove the problems that caused the stress. They can also be habit forming.  Exercise regularly - at least 3 times per week. Physical exercise can help to relieve that "uptight" feeling and will relax you.  The shortest distance between despair and hope is often a good night's sleep.  Go to bed and get up on time allowing yourself time for appointments without being rushed.  Take a short "time-out" period from any stressful situation that occurs during the day. Close your eyes and take some deep breaths. Starting with the muscles in your face, tense them, hold it for a few seconds, then relax. Repeat this with the muscles in your neck, shoulders, hand, stomach, back and legs.  Take good care of yourself. Eat a balanced diet and get plenty of rest.  Schedule time for having fun. Take a break from your daily routine to relax. HOME CARE INSTRUCTIONS   Call if you feel overwhelmed by your problems and feel you can no longer manage them on your own.  Return immediately if you feel like hurting yourself or someone else. Document Released: 10/29/2000 Document Revised: 07/28/2011 Document Reviewed: 06/21/2007 ExitCare Patient Information 2013 [employer].   

## 2013-05-05 NOTE — Progress Notes (Signed)
22 y.o. G0P0000 Single Caucasian Fe here for annual exam.  Periods none since Skyla IUD insertion. No partner change, no STD screening needed.Sees Dr. Tomasa Rand for Bipolar medication,every 6 months. Happy with IUD now. Complaining of incomplete emptying with bladder. Denies urinary freuquency or urgency or pain. Patient will empty bladder and leave restroom and then will have leakage within short periold  Patient's last menstrual period was 01/18/2012.          Sexually active: yes  The current method of family planning is IUD.    Exercising: no  exercise Smoker:  no  Health Maintenance: Pap:  02-19-12 neg MMG:  none Colonoscopy:  none BMD:   none TDaP:  8/11 Labs: Poct urine- Self breast exam:not done   reports that she has never smoked. She has never used smokeless tobacco. She reports that she does not drink alcohol or use illicit drugs.  Past Medical History  Diagnosis Date  . Depression   . Bipolar 1 disorder   . Concussion 09/2012  . Asthma     exercise induced  . Migraines     with aura    Past Surgical History  Procedure Laterality Date  . Appendectomy  age 34  . Tympanostomy tube placement      Current Outpatient Prescriptions  Medication Sig Dispense Refill  . Biotin 5000 MCG CAPS Take 5,000 mcg by mouth daily.      . divalproex (DEPAKOTE) 500 MG DR tablet Take 500-1,000 mg by mouth daily. Take 500mg  one day and 1,000 the next day.      . Levonorgestrel (SKYLA) 13.5 MG IUD by Intrauterine route.      . Soft Lens Products (VISINE FOR CONTACTS) SOLN Place 1-2 drops into both eyes as needed (dry eyes).       No current facility-administered medications for this visit.    Family History  Problem Relation Age of Onset  . Pancreatic cancer Maternal Grandmother   . Lung cancer Maternal Grandmother   . Liver cancer Maternal Grandmother   . Hypertension Maternal Grandmother   . Mitral valve prolapse Maternal Grandmother   . Thyroid disease Mother   . Heart  disease Paternal Grandmother   . Hypertension Paternal Grandfather   . Parkinson's disease Paternal Grandfather   . Alzheimer's disease Paternal Grandfather     ROS:  Pertinent items are noted in HPI.  Otherwise, a comprehensive ROS was negative.  Exam:   BP 102/64  Pulse 68  Resp 16  Ht 5' 3.75" (1.619 m)  Wt 209 lb (94.802 kg)  BMI 36.17 kg/m2  LMP 01/18/2012 Height: 5' 3.75" (161.9 cm)  Ht Readings from Last 3 Encounters:  05/05/13 5' 3.75" (1.619 m)  03/30/13 5\' 4"  (1.626 m)  01/03/13 5\' 4"  (1.626 m)    General appearance: alert, cooperative and appears stated age Head: Normocephalic, without obvious abnormality, atraumatic Neck: no adenopathy, supple, symmetrical, trachea midline and thyroid normal to inspection and palpation and non-palpable Lungs: clear to auscultation bilaterally Breasts: normal appearance, no masses or tenderness, No nipple retraction or dimpling, No nipple discharge or bleeding, No axillary or supraclavicular adenopathy Heart: regular rate and rhythm Abdomen: soft, non-tender; no masses,  no organomegaly Extremities: extremities normal, atraumatic, no cyanosis or edema Skin: Skin color, texture, turgor normal. No rashes or lesions Lymph nodes: Cervical, supraclavicular, and axillary nodes normal. No abnormal inguinal nodes palpated Neurologic: Grossly normal   Pelvic: External genitalia:  no lesions  Urethra:  normal appearing urethra with no masses, tenderness or lesions              Bartholin's and Skene's: normal                 Vagina: normal appearing vagina with normal color and discharge, no lesions              Cervix: normal, non tender, IUD string noted 2 cm at os              Pap taken: no Bimanual Exam:  Uterus:  normal size, contour, position, consistency, mobility, non-tender and anteverted              Adnexa: normal adnexa and no mass, fullness, tenderness               Rectovaginal: Confirms               Anus:   Deferred  A:  Well Woman with normal exam  Contraception Skyla IUD, normal surveillance exam  Due for removal 03/30/16  ? Incomplete emptying of bladder  P:   Reviewed health and wellness pertinent to exam  Discussed normal exam after insertion today.Patient aware of warning signs and symptoms with IUD  R/O UTI, Lab: urine culture, Encouraged patient to sit long enough to empty bladder, decrease caffeine intake and soda. Patient will try to take her time with emptying bladder.  Pap smear as per guidelines   pap smear not taken today counseled on breast self exam, STD prevention, HIV risk factors and prevention, adequate intake of calcium and vitamin D, diet and exercise  return annually or prn  An After Visit Summary was printed and given to the patient.

## 2013-05-06 LAB — URINE CULTURE
Colony Count: NO GROWTH
Organism ID, Bacteria: NO GROWTH

## 2013-05-09 ENCOUNTER — Telehealth: Payer: Self-pay | Admitting: Certified Nurse Midwife

## 2013-05-09 NOTE — Telephone Encounter (Signed)
LMTCB  aa 

## 2013-05-09 NOTE — Telephone Encounter (Signed)
Spoke with pt about urine culture being negative. Pt reports she has been taking more time to sit on the toilet to give her bladder time to fully empty, and that has helped some. Pt admits she has not given up caffeine and sodas, and states, "Maybe that will be my New Year's resolution." Advised that water is better for the bladder, and pt said, "I have noticed that I seem to pee more when I drink water, like it goes right through me." Pt will try to limit caffeine and sodas and see how that goes. Instructed pt to call back as needed. Agreeable.

## 2013-05-09 NOTE — Telephone Encounter (Signed)
Results from urine test

## 2013-05-11 NOTE — Progress Notes (Signed)
Reviewed personally.  M. Suzanne Romeo Zielinski, MD.  

## 2013-06-10 ENCOUNTER — Ambulatory Visit: Payer: Self-pay | Admitting: Gynecology

## 2013-06-22 ENCOUNTER — Encounter (HOSPITAL_COMMUNITY): Payer: Self-pay | Admitting: Emergency Medicine

## 2013-06-22 ENCOUNTER — Emergency Department (HOSPITAL_COMMUNITY): Payer: Commercial Managed Care - PPO

## 2013-06-22 DIAGNOSIS — R000 Tachycardia, unspecified: Secondary | ICD-10-CM | POA: Insufficient documentation

## 2013-06-22 DIAGNOSIS — J45901 Unspecified asthma with (acute) exacerbation: Secondary | ICD-10-CM | POA: Insufficient documentation

## 2013-06-22 DIAGNOSIS — F319 Bipolar disorder, unspecified: Secondary | ICD-10-CM | POA: Insufficient documentation

## 2013-06-22 DIAGNOSIS — Z792 Long term (current) use of antibiotics: Secondary | ICD-10-CM | POA: Insufficient documentation

## 2013-06-22 DIAGNOSIS — Z87828 Personal history of other (healed) physical injury and trauma: Secondary | ICD-10-CM | POA: Insufficient documentation

## 2013-06-22 DIAGNOSIS — R0789 Other chest pain: Secondary | ICD-10-CM | POA: Insufficient documentation

## 2013-06-22 DIAGNOSIS — R110 Nausea: Secondary | ICD-10-CM | POA: Insufficient documentation

## 2013-06-22 DIAGNOSIS — R209 Unspecified disturbances of skin sensation: Secondary | ICD-10-CM | POA: Insufficient documentation

## 2013-06-22 DIAGNOSIS — IMO0002 Reserved for concepts with insufficient information to code with codable children: Secondary | ICD-10-CM | POA: Insufficient documentation

## 2013-06-22 DIAGNOSIS — Z79899 Other long term (current) drug therapy: Secondary | ICD-10-CM | POA: Insufficient documentation

## 2013-06-22 DIAGNOSIS — G43909 Migraine, unspecified, not intractable, without status migrainosus: Secondary | ICD-10-CM | POA: Insufficient documentation

## 2013-06-22 LAB — BASIC METABOLIC PANEL
BUN: 11 mg/dL (ref 6–23)
CO2: 22 mEq/L (ref 19–32)
Calcium: 9.6 mg/dL (ref 8.4–10.5)
Chloride: 104 mEq/L (ref 96–112)
Creatinine, Ser: 0.53 mg/dL (ref 0.50–1.10)
GFR calc Af Amer: >90 mL/min (ref >90)
GFR calc non Af Amer: >90 mL/min (ref >90)
Glucose, Bld: 88 mg/dL (ref 70–99)
Potassium: 4 mEq/L (ref 3.7–5.3)
Sodium: 142 mEq/L (ref 137–147)

## 2013-06-22 LAB — POCT I-STAT TROPONIN I: Troponin i, poc: 0 ng/mL (ref 0.00–0.08)

## 2013-06-22 LAB — CBC
HCT: 38.8 % (ref 36.0–46.0)
Hemoglobin: 13.7 g/dL (ref 12.0–15.0)
MCH: 29.5 pg (ref 26.0–34.0)
MCHC: 35.3 g/dL (ref 30.0–36.0)
MCV: 83.4 fL (ref 78.0–100.0)
Platelets: 263 10*3/uL (ref 150–400)
RBC: 4.65 MIL/uL (ref 3.87–5.11)
RDW: 12.4 % (ref 11.5–15.5)
WBC: 11.4 10*3/uL — ABNORMAL HIGH (ref 4.0–10.5)

## 2013-06-22 NOTE — ED Notes (Addendum)
Pt reports since Sunday, having sharp CP; reports checked BP and was normal and reports HR was in 120's; pt reports sob, back pain, L arm tingling and neck pain; denies long trips; pt reports recent sinus infections--last abx was taking today; also on mucinex and flonase

## 2013-06-23 ENCOUNTER — Encounter (HOSPITAL_COMMUNITY): Payer: Self-pay | Admitting: Radiology

## 2013-06-23 ENCOUNTER — Emergency Department (HOSPITAL_COMMUNITY): Payer: Commercial Managed Care - PPO

## 2013-06-23 ENCOUNTER — Emergency Department (HOSPITAL_COMMUNITY)
Admission: EM | Admit: 2013-06-23 | Discharge: 2013-06-23 | Disposition: A | Discharge status: Home / Self Care | Payer: Commercial Managed Care - PPO | Attending: Emergency Medicine | Admitting: Emergency Medicine

## 2013-06-23 DIAGNOSIS — R079 Chest pain, unspecified: Secondary | ICD-10-CM

## 2013-06-23 DIAGNOSIS — R0789 Other chest pain: Secondary | ICD-10-CM

## 2013-06-23 LAB — D-DIMER, QUANTITATIVE: D-Dimer, Quant: 0.27 ug/mL-FEU (ref 0.00–0.48)

## 2013-06-23 MED ORDER — NAPROXEN 500 MG PO TABS: 500.0000 mg | ORAL_TABLET | Freq: Two times a day (BID) | ORAL | Status: DC

## 2013-06-23 MED ORDER — IOHEXOL 350 MG/ML SOLN
100.0000 mL | Freq: Once | INTRAVENOUS | Status: AC | PRN
  Administered 2013-06-23: 100 mL via INTRAVENOUS

## 2013-06-23 MED ORDER — METHYLPREDNISOLONE SODIUM SUCC 125 MG IJ SOLR
125.0000 mg | Freq: Once | INTRAMUSCULAR | Status: AC
  Administered 2013-06-23: 125 mg via INTRAVENOUS
  Filled 2013-06-23: qty 2

## 2013-06-23 MED ORDER — DIPHENHYDRAMINE HCL 50 MG/ML IJ SOLN
50.0000 mg | Freq: Once | INTRAMUSCULAR | Status: AC
  Administered 2013-06-23: 50 mg via INTRAVENOUS
  Filled 2013-06-23: qty 1

## 2013-06-23 NOTE — ED Notes (Signed)
Returned from CT.

## 2013-06-23 NOTE — ED Provider Notes (Signed)
CSN: [id number]     Arrival date & time 06/22/13  2115 History   First MD Initiated Contact with Patient 06/23/13 0140     Chief Complaint  Patient presents with  . Chest Pain   (Consider location/radiation/quality/duration/timing/severity/associated sxs/prior Treatment) HPI 23 year old female presents to the emergency department from home with complaint of chest pain.  She reports onset of pain on Monday.  Pain is located in the center of her chest, radiates outward.  Pain is described as sharp, intense, and a tightness.  Today, she had left arm tingling associated with the pain, as well as nausea.  Patient has felt short of breath at times.  She has had some radiation of the pain into her back.  Patient has past history of bipolar disorder.  She denies prolonged immobilization, no smoking.  She has a hormone impregnated IUD.  No leg swelling.  No family history of DVT or PE.  Patient has had racing heart at times.  Patient recently got over a sinus infection, completed antibiotics yesterday.   Past Medical History  Diagnosis Date  . Depression   . Bipolar 1 disorder   . Concussion 09/2012  . Asthma     exercise induced  . Migraines     with aura   Past Surgical History  Procedure Laterality Date  . Appendectomy  age 71  . Tympanostomy tube placement     Family History  Problem Relation Age of Onset  . Pancreatic cancer Maternal Grandmother   . Lung cancer Maternal Grandmother   . Liver cancer Maternal Grandmother   . Hypertension Maternal Grandmother   . Mitral valve prolapse Maternal Grandmother   . Thyroid disease Mother   . Heart disease Paternal Grandmother   . Hypertension Paternal Grandfather   . Parkinson's disease Paternal Grandfather   . Alzheimer's disease Paternal Grandfather    History  Substance Use Topics  . Smoking status: Never Smoker   . Smokeless tobacco: Never Used  . Alcohol Use: No   OB History   Grav Para Term Preterm Abortions TAB SAB Ect Mult  Living   0 0 0 0 0 0 0 0 0 0      Review of Systems  See History of Present Illness; otherwise all other systems are reviewed and negative Allergies  Iodine; Cortizone-10; and Lamictal  Home Medications   Current Outpatient Rx  Name  Route  Sig  Dispense  Refill  . Biotin 5000 MCG CAPS   Oral   Take 5,000 mcg by mouth daily.         . divalproex (DEPAKOTE) 500 MG DR tablet   Oral   Take 500-1,000 mg by mouth daily. Take 500mg  one day and 1,000 the next day.         . fluticasone (FLONASE) 50 MCG/ACT nasal spray   Each Nare   Place 1 spray into both nostrils daily as needed for allergies or rhinitis.         . Levonorgestrel (SKYLA) 13.5 MG IUD   Intrauterine   1 each by Intrauterine route continuous.          . Probiotic Product (PROBIOTIC FORMULA) CAPS   Oral   Take 1 capsule by mouth daily.         Marland Kitchen levofloxacin (LEVAQUIN) 750 MG tablet   Oral   Take 750 mg by mouth daily. Started 06/13/13,for 10 days, ending 06/22/13         . naproxen (NAPROSYN) 500 MG tablet  Oral   Take 1 tablet (500 mg total) by mouth 2 (two) times daily.   30 tablet   0    BP 96/52  Pulse 86  Temp(Src) 98 F (36.7 C) (Oral)  Resp 16  SpO2 97% Physical Exam  Nursing note and vitals reviewed. Constitutional: She is oriented to person, place, and time. She appears well-developed and well-nourished. No distress.  HENT:  Head: Normocephalic and atraumatic.  Right Ear: External ear normal.  Left Ear: External ear normal.  Nose: Nose normal.  Mouth/Throat: Oropharynx is clear and moist.  Eyes: Conjunctivae and EOM are normal. Pupils are equal, round, and reactive to light.  Neck: Normal range of motion. Neck supple. No JVD present. No tracheal deviation present. No thyromegaly present.  Cardiovascular: Normal rate, regular rhythm, normal heart sounds and intact distal pulses.  Exam reveals no gallop and no friction rub.   No murmur heard. Pulmonary/Chest: Effort normal and  breath sounds normal. No stridor. No respiratory distress. She has no wheezes. She has no rales. She exhibits tenderness (tender to palpation over lower part of sternum, which reproduces pain).  Abdominal: Soft. Bowel sounds are normal. She exhibits no distension and no mass. There is no tenderness. There is no rebound and no guarding.  Musculoskeletal: Normal range of motion. She exhibits no edema and no tenderness.  Lymphadenopathy:    She has no cervical adenopathy.  Neurological: She is alert and oriented to person, place, and time. She has normal reflexes. No cranial nerve deficit. She exhibits normal muscle tone. Coordination normal.  Skin: Skin is warm and dry. No rash noted. No erythema. No pallor.  Psychiatric: She has a normal mood and affect. Her behavior is normal. Judgment and thought content normal.    ED Course  Procedures (including critical care time) Labs Review Labs Reviewed  CBC - Abnormal; Notable for the following:    WBC 11.4 (*)    All other components within normal limits  BASIC METABOLIC PANEL  D-DIMER, QUANTITATIVE  POCT I-STAT TROPONIN I   Imaging Review Dg Chest 2 View  06/22/2013   CLINICAL DATA:  Shortness of breath and chest pain  EXAM: CHEST  2 VIEW  COMPARISON:  January 03, 2013  FINDINGS: Lungs are clear. Heart size and pulmonary vascularity are normal. No adenopathy. No pneumothorax. No bone lesions.  IMPRESSION: No abnormality noted.   Electronically Signed   By: Lowella Grip M.D.   On: 06/22/2013 21:51   Ct Angio Chest Pe W/cm &/or Wo Cm  06/23/2013   CLINICAL DATA:  Shortness of breath, chest pain  EXAM: CT ANGIOGRAPHY CHEST WITH CONTRAST  TECHNIQUE: Multidetector CT imaging of the chest was performed using the standard protocol during bolus administration of intravenous contrast. Multiplanar CT image reconstructions including MIPs were obtained to evaluate the vascular anatomy.  CONTRAST:  141mL OMNIPAQUE IOHEXOL 350 MG/ML SOLN  COMPARISON:  Prior  radiograph from 06/22/2013  FINDINGS: No pathologically enlarged mediastinal, hilar, or axillary lymph nodes are identified. Normal residual thymic tissue noted within the anterior mediastinum.  Aorta and great vessels are within normal limits.  Heart size is normal.  No pericardial effusion.  Pulmonary arterial tree is well opacified. No filling defects to suggest acute pulmonary embolism identified. Re-formatted imaging confirms these findings.  Lungs are clear without focal infiltrate, pulmonary edema, or pleural effusion. No pulmonary nodule or mass.  Visualized portions of the upper abdomen are within normal limits.  No acute osseous abnormality. No focal lytic or blastic  osseous lesions.  IMPRESSION: No CT evidence of acute pulmonary embolism or other abnormality within the chest.   Electronically Signed   By: Jeannine Boga M.D.   On: 06/23/2013 04:25    EKG Interpretation    Date/Time:  Wednesday June 22 2013 21:22:28 EST Ventricular Rate:  125 PR Interval:  134 QRS Duration: 76 QT Interval:  300 QTC Calculation: 433 R Axis:   53 Text Interpretation:  Sinus tachycardia Nonspecific T wave abnormality Abnormal ECG No old tracing to compare Confirmed by Matrice Herro  MD, Virgie Chery ([id number]) on 06/23/2013 1:09:56 AM            MDM   1. Chest pain of unknown etiology   2. Sternal pain    23 year old female with chest pain, tachycardia, dyspnea.  Concern for possible PE.  Patient has low.  D-dimer, however constellation of symptoms is strongly suggesting PE.  Plan for CT angio chest.  If negative, will treat with anti-inflammatories and close followup with her primary care Dr.    Kalman Drape, MD 06/23/13 [phone]

## 2013-06-23 NOTE — Discharge Instructions (Signed)
Your workup has not shown a specific cause for your symptoms.  Your CAT scan was negative date for blood clots, inflammation, or infection.  Take medications as prescribed.  Followup with your Dr. for recheck in 3-5 days.  Return to the emergency room for worsening condition or new concerning symptoms.   Chest Pain Observation It is often hard to give a specific diagnosis for the cause of chest pain. Among other possibilities your symptoms might be caused by inadequate oxygen delivery to your heart (angina). Angina that is not treated or evaluated can lead to a heart attack (myocardial infarction) or death. Blood tests, electrocardiograms, and X-rays may have been done to help determine a possible cause of your chest pain. After evaluation and observation, your health care provider has determined that it is unlikely your pain was caused by an unstable condition that requires hospitalization. However, a full evaluation of your pain may need to be completed, with additional diagnostic testing as directed. It is very important to keep your follow-up appointments. Not keeping your follow-up appointments could result in permanent heart damage, disability, or death. If there is any problem keeping your follow-up appointments, you must call your health care provider. HOME CARE INSTRUCTIONS  Due to the slight chance that your pain could be angina, it is important to follow your health care provider's treatment plan and also maintain a healthy lifestyle:  Maintain or work toward achieving a healthy weight.  Stay physically active and exercise regularly.  Decrease your salt intake.  Eat a balanced, healthy diet. Talk to a dietician to learn about heart healthy foods.  Increase your fiber intake by including whole grains, vegetables, fruits, and nuts in your diet.  Avoid situations that cause stress, anger, or depression.  Take medicines as advised by your health care provider. Report any side effects to  your health care provider. Do not stop medicines or adjust the dosages on your own.  Quit smoking. Do not use nicotine patches or gum until you check with your health care provider.  Keep your blood pressure, blood sugar, and cholesterol levels within normal limits.  Limit alcohol intake to no more than 1 drink per day for women that are not pregnant and 2 drinks per day for men.  Do not abuse drugs. SEEK IMMEDIATE MEDICAL CARE IF: You have severe chest pain or pressure which may include symptoms such as:  You feel pain or pressure in you arms, neck, jaw, or back.  You have severe back or abdominal pain, feel sick to your stomach (nauseous), or throw up (vomit).  You are sweating profusely.  You are having a fast or irregular heartbeat.  You feel short of breath while at rest.  You notice increasing shortness of breath during rest, sleep, or with activity.  You have chest pain that does not get better after rest or after taking your usual medicine.  You wake from sleep with chest pain.  You are unable to sleep because you cannot breathe.  You develop a frequent cough or you are coughing up blood.  You feel dizzy, faint, or experience extreme fatigue.  You develop severe weakness, dizziness, fainting, or chills. Any of these symptoms may represent a serious problem that is an emergency. Do not wait to see if the symptoms will go away. Call your local emergency services (911 in the [place] Do not drive yourself to the hospital. MAKE SURE YOU:  Understand these instructions.  Will watch your condition.  Will get help right  away if you are not doing well or get worse. Document Released: 06/07/2010 Document Revised: 01/05/2013 Document Reviewed: 11/04/2012 [facility] Patient Information 2014 [place], [place].  Musculoskeletal Pain Musculoskeletal pain is muscle and boney aches and pains. These pains can occur in any part of the body. Your caregiver may treat you without knowing  the cause of the pain. They may treat you if blood or urine tests, X-rays, and other tests were normal.  CAUSES There is often not a definite cause or reason for these pains. These pains may be caused by a type of germ (virus). The discomfort may also come from overuse. Overuse includes working out too hard when your body is not fit. Boney aches also come from weather changes. Bone is sensitive to atmospheric pressure changes. HOME CARE INSTRUCTIONS   Ask when your test results will be ready. Make sure you get your test results.  Only take over-the-counter or prescription medicines for pain, discomfort, or fever as directed by your caregiver. If you were given medications for your condition, do not drive, operate machinery or power tools, or sign legal documents for 24 hours. Do not drink alcohol. Do not take sleeping pills or other medications that may interfere with treatment.  Continue all activities unless the activities cause more pain. When the pain lessens, slowly resume normal activities. Gradually increase the intensity and duration of the activities or exercise.  During periods of severe pain, bed rest may be helpful. Lay or sit in any position that is comfortable.  Putting ice on the injured area.  Put ice in a bag.  Place a towel between your skin and the bag.  Leave the ice on for 15 to 20 minutes, 3 to 4 times a day.  Follow up with your caregiver for continued problems and no reason can be found for the pain. If the pain becomes worse or does not go away, it may be necessary to repeat tests or do additional testing. Your caregiver may need to look further for a possible cause. SEEK IMMEDIATE MEDICAL CARE IF:  You have pain that is getting worse and is not relieved by medications.  You develop chest pain that is associated with shortness or breath, sweating, feeling sick to your stomach (nauseous), or throw up (vomit).  Your pain becomes localized to the abdomen.  You  develop any new symptoms that seem different or that concern you. MAKE SURE YOU:   Understand these instructions.  Will watch your condition.  Will get help right away if you are not doing well or get worse. Document Released: 05/05/2005 Document Revised: 07/28/2011 Document Reviewed: 01/07/2013 [facility] Patient Information 2014 [place].

## 2013-06-23 NOTE — ED Notes (Signed)
Patient transported to CT 

## 2014-02-17 IMAGING — CT CT ANGIO CHEST
2 of 9 series · 19 of 46 positions shown · IV contrast (omnipaque)
Comparison: Prior radiograph from 06/22/2013

CLINICAL DATA: Shortness of breath, chest pain

EXAM:
CT ANGIOGRAPHY CHEST WITH CONTRAST
TECHNIQUE: Multidetector CT imaging of the chest was performed using the
standard protocol during bolus administration of intravenous
contrast. Multiplanar CT image reconstructions including MIPs were
obtained to evaluate the vascular anatomy.
CONTRAST:  100mL OMNIPAQUE IOHEXOL 350 MG/ML SOLN

[Series 5: thins · axial · 0.61mm/px · z∈[-229,-30]mm · 16 of 225 slices shown]
[im 13/225  lung]
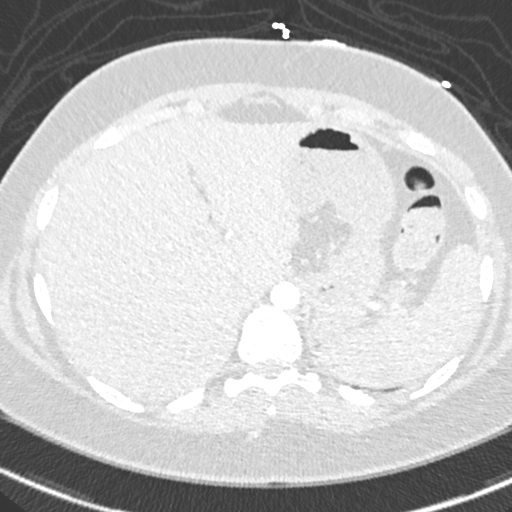
[im 25/225  soft-tissue]
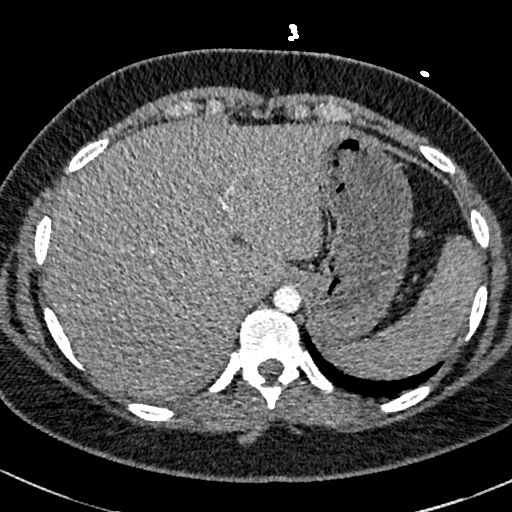
[im 38/225  lung]
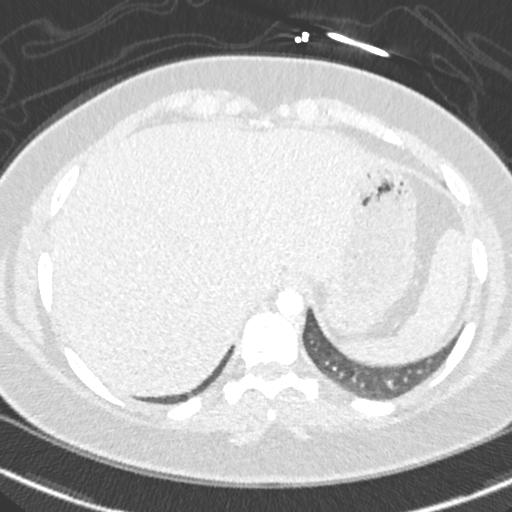
[im 50/225  soft-tissue]
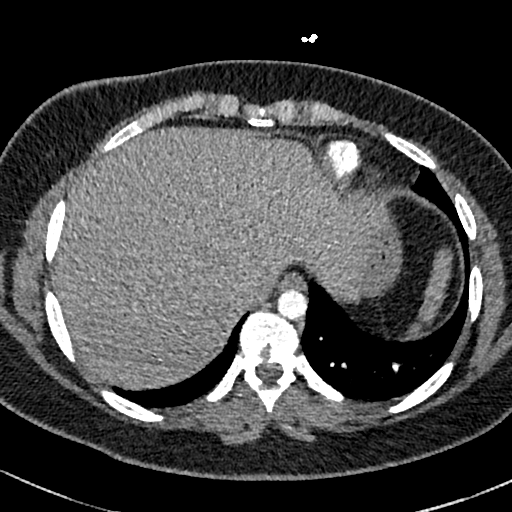
[im 63/225  lung]
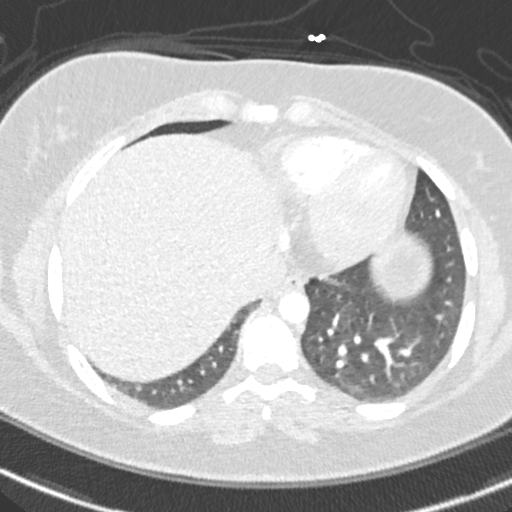
[im 75/225  soft-tissue]
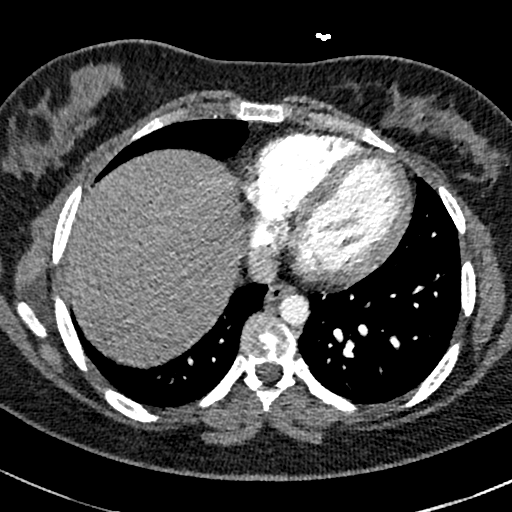
[im 88/225  lung]
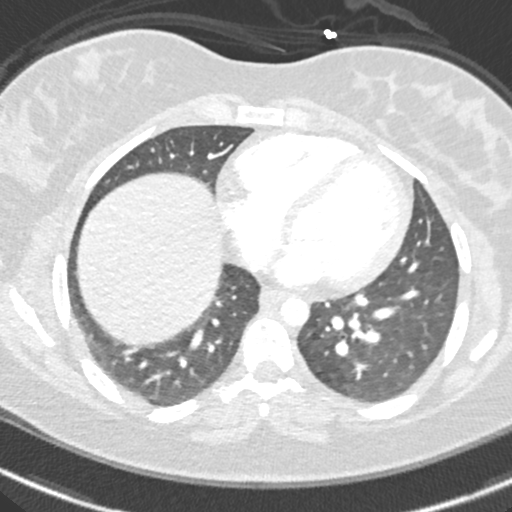
[im 100/225  soft-tissue]
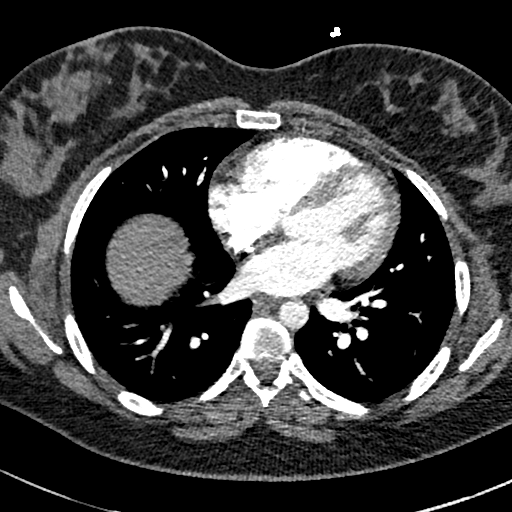
[im 125/225  lung]
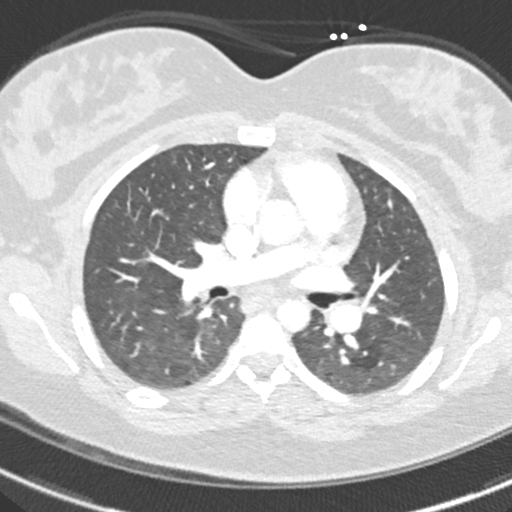
[im 137/225  soft-tissue]
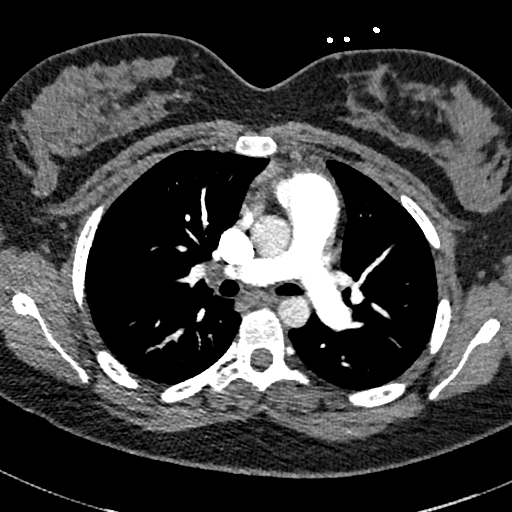
[im 150/225  lung]
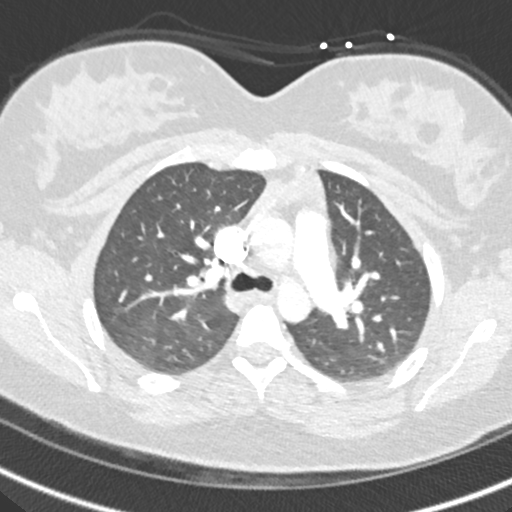
[im 162/225  soft-tissue]
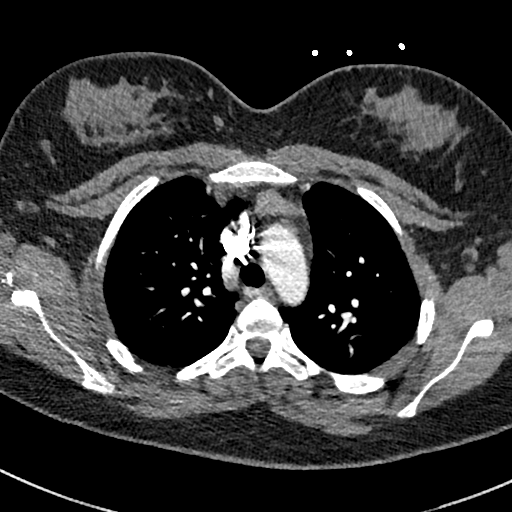
[im 175/225  lung]
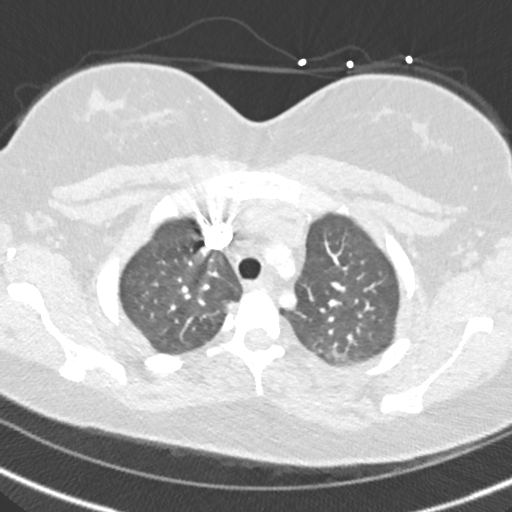
[im 187/225  soft-tissue]
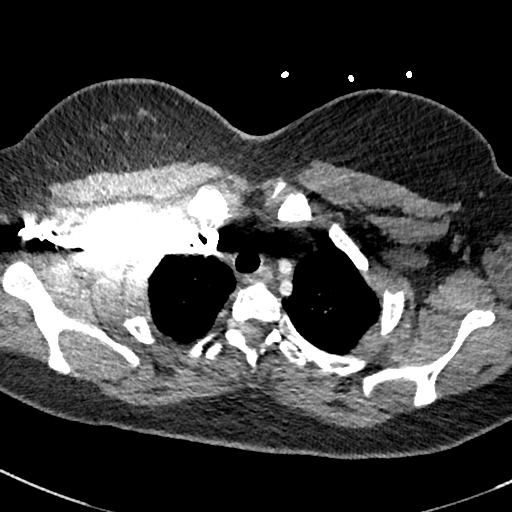
[im 200/225  lung]
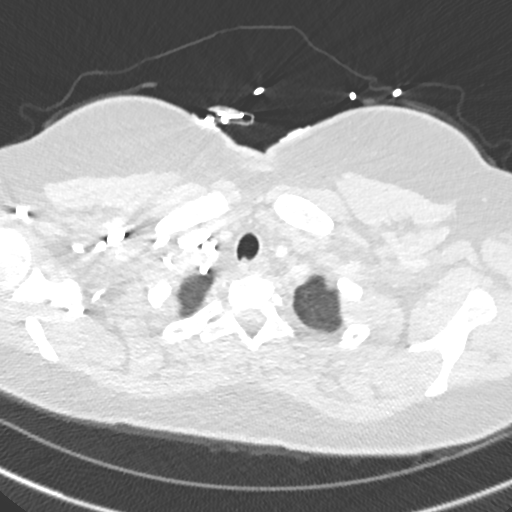
[im 212/225  soft-tissue]
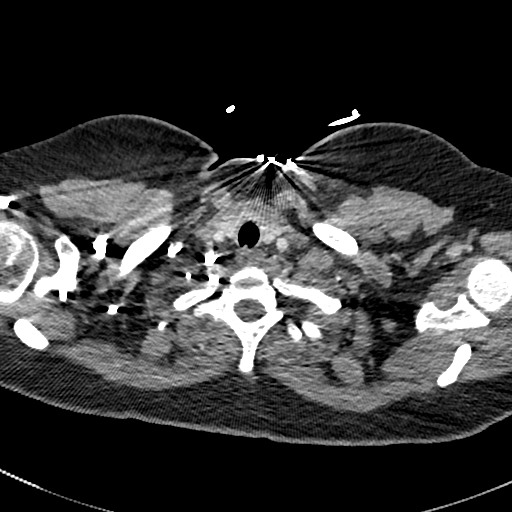

[Series 7: coronal mpr · coronal · 0.59mm/px · 3 of 99 slices shown]
[im 25/99  soft-tissue]
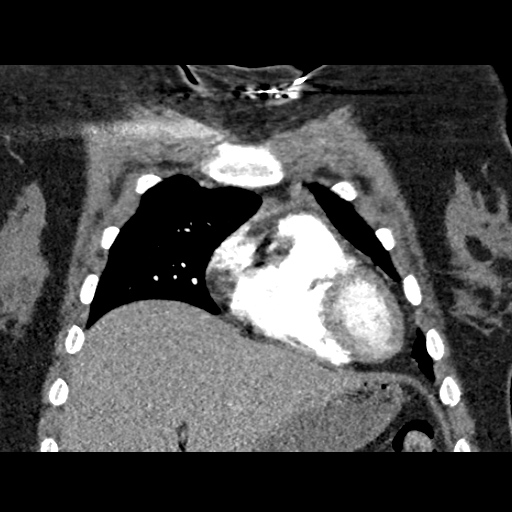
[im 50/99  soft-tissue]
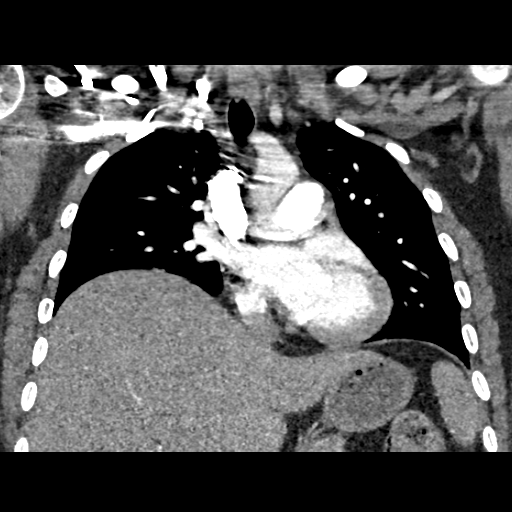
[im 74/99  soft-tissue]
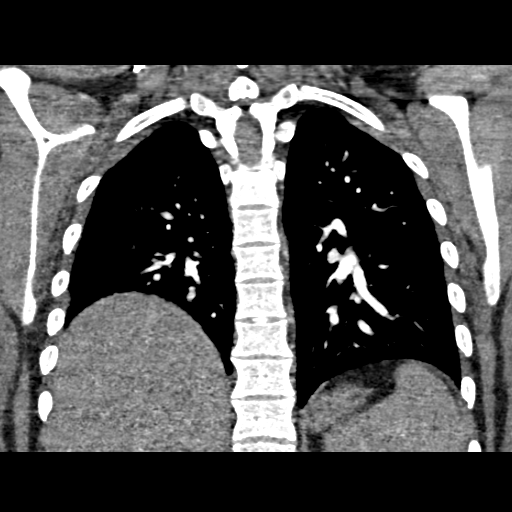

[19 of 46 positions shown; findings below may reference images not displayed]

FINDINGS: No pathologically enlarged mediastinal, hilar, or axillary lymph
nodes are identified. Normal residual thymic tissue noted within the
anterior mediastinum.

Aorta and great vessels are within normal limits.

Heart size is normal.  No pericardial effusion.

Pulmonary arterial tree is well opacified. No filling defects to
suggest acute pulmonary embolism identified. Re-formatted imaging
confirms these findings.

Lungs are clear without focal infiltrate, pulmonary edema, or
pleural effusion. No pulmonary nodule or mass.

Visualized portions of the upper abdomen are within normal limits.

No acute osseous abnormality. No focal lytic or blastic osseous
lesions.
IMPRESSION: No CT evidence of acute pulmonary embolism or other abnormality
within the chest.

## 2014-04-04 ENCOUNTER — Encounter: Payer: Self-pay | Admitting: Certified Nurse Midwife

## 2014-04-04 ENCOUNTER — Ambulatory Visit (INDEPENDENT_AMBULATORY_CARE_PROVIDER_SITE_OTHER): Payer: Commercial Managed Care - PPO | Admitting: Certified Nurse Midwife

## 2014-04-04 VITALS — BP 110/64 | HR 68 | Resp 16 | Ht 64.0 in | Wt 217.0 lb

## 2014-04-04 DIAGNOSIS — R109 Unspecified abdominal pain: Secondary | ICD-10-CM

## 2014-04-04 DIAGNOSIS — N898 Other specified noninflammatory disorders of vagina: Secondary | ICD-10-CM

## 2014-04-04 DIAGNOSIS — Z3049 Encounter for surveillance of other contraceptives: Secondary | ICD-10-CM

## 2014-04-04 LAB — POCT URINE PREGNANCY: Preg Test, Ur: NEGATIVE

## 2014-04-04 NOTE — Patient Instructions (Signed)
Levonorgestrel intrauterine device (IUD) What is this medicine? LEVONORGESTREL IUD (LEE voe nor jes trel) is a contraceptive (birth control) device. The device is placed inside the uterus by a healthcare professional. It is used to prevent pregnancy and can also be used to treat heavy bleeding that occurs during your period. Depending on the device, it can be used for 3 to 5 years. This medicine may be used for other purposes; ask your health care provider or pharmacist if you have questions. COMMON BRAND NAME(S): LILETTA, Mirena, Skyla What should I tell my health care provider before I take this medicine? They need to know if you have any of these conditions: -abnormal Pap smear -cancer of the breast, uterus, or cervix -diabetes -endometritis -genital or pelvic infection now or in the past -have more than one sexual partner or your partner has more than one partner -heart disease -history of an ectopic or tubal pregnancy -immune system problems -IUD in place -liver disease or tumor -problems with blood clots or take blood-thinners -use intravenous drugs -uterus of unusual shape -vaginal bleeding that has not been explained -an unusual or allergic reaction to levonorgestrel, other hormones, silicone, or polyethylene, medicines, foods, dyes, or preservatives -pregnant or trying to get pregnant -breast-feeding How should I use this medicine? This device is placed inside the uterus by a health care professional. Talk to your pediatrician regarding the use of this medicine in children. Special care may be needed. Overdosage: If you think you have taken too much of this medicine contact a poison control center or emergency room at once. NOTE: This medicine is only for you. Do not share this medicine with others. What if I miss a dose? This does not apply. What may interact with this medicine? Do not take this medicine with any of the following  medications: -amprenavir -bosentan -fosamprenavir This medicine may also interact with the following medications: -aprepitant -barbiturate medicines for inducing sleep or treating seizures -bexarotene -griseofulvin -medicines to treat seizures like carbamazepine, ethotoin, felbamate, oxcarbazepine, phenytoin, topiramate -modafinil -pioglitazone -rifabutin -rifampin -rifapentine -some medicines to treat HIV infection like atazanavir, indinavir, lopinavir, nelfinavir, tipranavir, ritonavir -St. John's wort -warfarin This list may not describe all possible interactions. Give your health care provider a list of all the medicines, herbs, non-prescription drugs, or dietary supplements you use. Also tell them if you smoke, drink alcohol, or use illegal drugs. Some items may interact with your medicine. What should I watch for while using this medicine? Visit your doctor or health care professional for regular check ups. See your doctor if you or your partner has sexual contact with others, becomes HIV positive, or gets a sexual transmitted disease. This product does not protect you against HIV infection (AIDS) or other sexually transmitted diseases. You can check the placement of the IUD yourself by reaching up to the top of your vagina with clean fingers to feel the threads. Do not pull on the threads. It is a good habit to check placement after each menstrual period. Call your doctor right away if you feel more of the IUD than just the threads or if you cannot feel the threads at all. The IUD may come out by itself. You may become pregnant if the device comes out. If you notice that the IUD has come out use a backup birth control method like condoms and call your health care provider. Using tampons will not change the position of the IUD and are okay to use during your period. What side effects may   I notice from receiving this medicine? Side effects that you should report to your doctor or  health care professional as soon as possible: -allergic reactions like skin rash, itching or hives, swelling of the face, lips, or tongue -fever, flu-like symptoms -genital sores -high blood pressure -no menstrual period for 6 weeks during use -pain, swelling, warmth in the leg -pelvic pain or tenderness -severe or sudden headache -signs of pregnancy -stomach cramping -sudden shortness of breath -trouble with balance, talking, or walking -unusual vaginal bleeding, discharge -yellowing of the eyes or skin Side effects that usually do not require medical attention (report to your doctor or health care professional if they continue or are bothersome): -acne -breast pain -change in sex drive or performance -changes in weight -cramping, dizziness, or faintness while the device is being inserted -headache -irregular menstrual bleeding within first 3 to 6 months of use -nausea This list may not describe all possible side effects. Call your doctor for medical advice about side effects. You may report side effects to FDA at 1-800-FDA-1088. Where should I keep my medicine? This does not apply. NOTE: This sheet is a summary. It may not cover all possible information. If you have questions about this medicine, talk to your doctor, pharmacist, or health care provider.  2015, [employer]vier/[employer] Standard. (2011-06-05 13:54:04)  

## 2014-04-04 NOTE — Progress Notes (Signed)
Reviewed personally.  M. Suzanne Amandamarie Feggins, MD.  

## 2014-04-04 NOTE — Progress Notes (Signed)
23 y.o. Single Caucasian [id number] for evaluation of Skyla IUD initiated on 03/30/13 for contraception. Patient has had no menses since insertion. Patient is experiencing random nausea, with breast tenderness and cramping happening once during month. Patient is on Depakote and has changed pharmacy with ? New generic. Recent lab work with PCP for ?chest issues all normal per patient. Nausea is not associated with cramping. Denies fever, chills, abdominal or pelvic pain. Cramping is mild like menstrual cramps, no issues with. On weight watchers diet now for weight loss, so diet has changed. Denies constipation or diarrhea. No other medication use or other problems.   O: Healthy female, WD WN Affect: normal orientation X 3  Skin: Warm and dry Abdomen: soft, non tender, normal BS, negative suprapubic pain External genital normal no lesions BUS normal, Bladder, non tender Vagina: normal discharge, no odor, wet prep taken ph. 3.5 Wet prep negative for pathogens Cervix: normal, no lesions, no CMT, IUD string visualized with string length no change Uterus: normal, non tender Adnexal: normal no masses, non tender  POCT UPT negative   A: Normal pelvic exam Skyla IUD normal surveillance ? Medication nausea or GI issues   P: Reviewed findings of normal exam, and IUD no evidence of any changes. Discussed cramping and breast tenderness are probably related to just normal menses cycle, just no bleeding. Reassured Patient to follow up with PCP regarding Depakote use and labs. Warning signs of pelvic pain given and to advise.  RV prn

## 2014-05-23 ENCOUNTER — Ambulatory Visit: Payer: Commercial Managed Care - PPO | Admitting: Certified Nurse Midwife

## 2014-07-28 ENCOUNTER — Ambulatory Visit (INDEPENDENT_AMBULATORY_CARE_PROVIDER_SITE_OTHER): Payer: Commercial Managed Care - PPO | Admitting: Certified Nurse Midwife

## 2014-07-28 ENCOUNTER — Encounter: Payer: Self-pay | Admitting: Certified Nurse Midwife

## 2014-07-28 ENCOUNTER — Other Ambulatory Visit: Payer: Self-pay | Admitting: Certified Nurse Midwife

## 2014-07-28 VITALS — BP 100/62 | HR 68 | Resp 16 | Ht 64.0 in | Wt 216.0 lb

## 2014-07-28 DIAGNOSIS — Z0000 Encounter for general adult medical examination without abnormal findings: Secondary | ICD-10-CM | POA: Diagnosis not present

## 2014-07-28 DIAGNOSIS — Z01419 Encounter for gynecological examination (general) (routine) without abnormal findings: Secondary | ICD-10-CM | POA: Diagnosis not present

## 2014-07-28 DIAGNOSIS — N6320 Unspecified lump in the left breast, unspecified quadrant: Secondary | ICD-10-CM

## 2014-07-28 DIAGNOSIS — N63 Unspecified lump in breast: Secondary | ICD-10-CM | POA: Diagnosis not present

## 2014-07-28 DIAGNOSIS — K529 Noninfective gastroenteritis and colitis, unspecified: Secondary | ICD-10-CM

## 2014-07-28 DIAGNOSIS — Z124 Encounter for screening for malignant neoplasm of cervix: Secondary | ICD-10-CM

## 2014-07-28 LAB — POCT URINALYSIS DIPSTICK
Bilirubin, UA: NEGATIVE
Blood, UA: NEGATIVE
Glucose, UA: NEGATIVE
Ketones, UA: NEGATIVE
Leukocytes, UA: NEGATIVE
Nitrite, UA: NEGATIVE
Protein, UA: NEGATIVE
Urobilinogen, UA: NEGATIVE
pH, UA: 5

## 2014-07-28 LAB — CBC
HCT: 39.2 % (ref 36.0–46.0)
Hemoglobin: 13.2 g/dL (ref 12.0–15.0)
MCH: 28.6 pg (ref 26.0–34.0)
MCHC: 33.7 g/dL (ref 30.0–36.0)
MCV: 85 fL (ref 78.0–100.0)
MPV: 9.7 fL (ref 8.6–12.4)
Platelets: 335 10*3/uL (ref 150–400)
RBC: 4.61 MIL/uL (ref 3.87–5.11)
RDW: 13.4 % (ref 11.5–15.5)
WBC: 10.2 10*3/uL (ref 4.0–10.5)

## 2014-07-28 LAB — TSH: TSH: 2.579 u[IU]/mL (ref 0.350–4.500)

## 2014-07-28 NOTE — Progress Notes (Signed)
24 y.o. G0P0000 Single  Caucasian Fe here for annual exam. Periods scant to none with IUD. Happy with choice. Sees [facility] for [facility]ex, labs and medication management. Patient continues to have ? IBS with diarrhea off and on, which has increased. No STD screening needed. Would like to have labs, due to increase fatigue. No other health issues.  No LMP recorded. Patient is not currently having periods (Reason: IUD).          Sexually active: Yes.    The current method of family planning is IUD.    Exercising: No.  exercise Smoker:  no  Health Maintenance: Pap:  02-19-12 neg MMG:  none Colonoscopy:  none BMD:   none TDaP:  2011 Labs: neg Self breast exam: not done   reports that she has never smoked. She has never used smokeless tobacco. She reports that she does not drink alcohol or use illicit drugs.  Past Medical History  Diagnosis Date  . Depression   . Bipolar 1 disorder   . Concussion 09/2012  . Asthma     exercise induced  . Migraines     with aura    Past Surgical History  Procedure Laterality Date  . Appendectomy  age 21  . Tympanostomy tube placement    . Skyla insertion  03-30-13    Current Outpatient Prescriptions  Medication Sig Dispense Refill  . divalproex (DEPAKOTE) 500 MG DR tablet Take by mouth. Take 500mg  one day and 1,000 the next day, rotating    . Levonorgestrel (SKYLA) 13.5 MG IUD 1 each by Intrauterine route continuous.      No current facility-administered medications for this visit.    Family History  Problem Relation Age of Onset  . Pancreatic cancer Maternal Grandmother   . Lung cancer Maternal Grandmother   . Liver cancer Maternal Grandmother   . Hypertension Maternal Grandmother   . Mitral valve prolapse Maternal Grandmother   . Thyroid disease Mother   . Heart disease Paternal Grandmother   . Hypertension Paternal Grandfather   . Parkinson's disease Paternal Grandfather   . Alzheimer's disease Paternal Grandfather      ROS:  Pertinent items are noted in HPI.  Otherwise, a comprehensive ROS was negative.  Exam:   BP 100/62 mmHg  Pulse 68  Resp 16  Ht 5\' 4"  (1.626 m)  Wt 216 lb (97.977 kg)  BMI 37.06 kg/m2 Height: 5\' 4"  (162.6 cm) Ht Readings from Last 3 Encounters:  07/28/14 5\' 4"  (1.626 m)  04/04/14 5\' 4"  (1.626 m)  05/05/13 5' 3.75" (1.619 m)    General appearance: alert, cooperative and appears stated age Head: Normocephalic, without obvious abnormality, atraumatic Neck: no adenopathy, supple, symmetrical, trachea midline and thyroid normal to inspection and palpation Lungs: clear to auscultation bilaterally Breasts: normal appearance, no masses or tenderness, No nipple retraction or dimpling, No nipple discharge or bleeding, No axillary or supraclavicular adenopathy, left breast  mass at 5 o'clock 1 FB from aerola, non tender, mobile Heart: regular rate and rhythm Abdomen: soft, non-tender; no masses,  no organomegaly Extremities: extremities normal, atraumatic, no cyanosis or edema Skin: Skin color, texture, turgor normal. No rashes or lesions Lymph nodes: Cervical, supraclavicular, and axillary nodes normal. No abnormal inguinal nodes palpated Neurologic: Grossly normal   Pelvic: External genitalia:  no lesions              Urethra:  normal appearing urethra with no masses, tenderness or lesions  Bartholin's and Skene's: normal                 Vagina: normal appearing vagina with normal color and discharge, no lesions              Cervix: normal, non tender, no lesions              Pap taken: Yes.   Bimanual Exam:  Uterus:  normal size, contour, position, consistency, mobility, non-tender              Adnexa: normal adnexa and no mass, fullness, tenderness               Rectovaginal: Confirms               Anus:  normal sphincter tone, no lesions  Chaperone present: Yes  A:  Well Woman with normal exam  Contraception Skyla IUD  ? 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 Removal due 2017  Discussed getting GI under control with referral to Dr.Mann. Patient agreeable. She will be called with referral information.  Discussed left breast mass finding and need for evaluation with [place]. Discussed probable benign finding of ? Fibroadenoma or cyst, but can not diagnose with just feeling area. Patient agreeable and will be scheduled today.  Labs: TSH,CBC,Vitamin D  Pap smear taken today with HPV reflex.   counseled on breast self exam, adequate intake of calcium and vitamin D, diet and exercise  return annually or prn  An After Visit Summary was printed and given to the patient.

## 2014-07-28 NOTE — Patient Instructions (Signed)

## 2014-07-28 NOTE — Progress Notes (Signed)
Patient scheduled while in office at [facility] for left breast ultrasound. Patient requests Friday appointment. Appointment scheduled for 08/04/2014 at 1pm. Patient is agreeable to date and time.

## 2014-07-29 LAB — VITAMIN D 25 HYDROXY (VIT D DEFICIENCY, FRACTURES): Vit D, 25-Hydroxy: 20 ng/mL — ABNORMAL LOW (ref 30–100)

## 2014-07-31 ENCOUNTER — Telehealth: Payer: Self-pay | Admitting: Certified Nurse Midwife

## 2014-07-31 NOTE — Telephone Encounter (Signed)
Detailed message was already left on patients voicemail.

## 2014-07-31 NOTE — Progress Notes (Signed)
Reviewed personally.  M. Suzanne Kaaliyah Kita, MD.  

## 2014-07-31 NOTE — Telephone Encounter (Signed)
Pt returning call to [place] for lab results. Pt is at work and gives permission for detailed message on her cell phone [phone]

## 2014-08-02 LAB — IPS PAP TEST WITH REFLEX TO HPV

## 2014-08-03 ENCOUNTER — Other Ambulatory Visit: Payer: Self-pay

## 2014-08-03 ENCOUNTER — Other Ambulatory Visit: Payer: Self-pay | Admitting: Certified Nurse Midwife

## 2014-08-03 DIAGNOSIS — N6320 Unspecified lump in the left breast, unspecified quadrant: Secondary | ICD-10-CM

## 2014-08-04 ENCOUNTER — Ambulatory Visit
Admission: RE | Admit: 2014-08-04 | Discharge: 2014-08-04 | Disposition: A | Discharge status: Home / Self Care | Payer: Commercial Managed Care - PPO | Source: Ambulatory Visit | Attending: Certified Nurse Midwife | Admitting: Certified Nurse Midwife

## 2014-08-04 DIAGNOSIS — N6320 Unspecified lump in the left breast, unspecified quadrant: Secondary | ICD-10-CM

## 2014-08-16 ENCOUNTER — Telehealth: Payer: Self-pay | Admitting: Certified Nurse Midwife

## 2014-08-16 NOTE — Telephone Encounter (Signed)
Spoke with patient. Patient has IUD that was placed 03/30/2013. Patient has not had a cycle since IUD was inserted. States that she woke up this morning with cramping and spotting. "It feels like period cramps and I only notice the dark brown bleeding when I wipe." Patient has taken aleve with some relief. Patient states she has never been able to feel her IUD strings. Cramping is radiating down patients groin. Advised will need to be seen in office for evaluation. Patient is agreeable. Declines appointment until Friday morning. Appointment scheduled for 4/1 at 11:15am with Dr.Miller. Patient is agreeable to date and time. Patient will monitor symptoms and if cramping/bleeding increases she will call to be seen sooner or seek immediate care. Will continue to take 800mg  of motrin/ibuprofen every 8 hours for relief.  Routing to provider for final review. Patient agreeable to disposition. Will close encounter

## 2014-08-16 NOTE — Telephone Encounter (Signed)
Left message to call Kaitlyn at [phone]. 

## 2014-08-16 NOTE — Telephone Encounter (Signed)
Returned call

## 2014-08-16 NOTE — Telephone Encounter (Signed)
Patient calling requesting to speak with the nurse. She said, "I have an IUD but am having cramping and spotting. I have not had a period in two years."

## 2014-08-17 ENCOUNTER — Telehealth: Payer: Self-pay | Admitting: Obstetrics & Gynecology

## 2014-08-17 NOTE — Telephone Encounter (Signed)
Spoke with patient.  Patient has IUD in place. Patient states "I am not having any cramping any more. I am having dark brown spotting when I use the restroom but that is all." Patient would like to cancel appointment for tomorrow 4/1 at 11:30am with Dr.Miller at this time. Will monitor symptoms and if cramping returns, has increased pain or bleeding will call to reschedule appointment.  Routing to provider for final review. Patient agreeable to disposition. Will close encounter

## 2014-08-17 NOTE — Telephone Encounter (Signed)
Pt says she is not cramping anymore but still has slight bleeding. Would like nurse to call about appointment scheduled tomorrow.

## 2014-08-18 ENCOUNTER — Ambulatory Visit: Payer: Commercial Managed Care - PPO | Admitting: Obstetrics & Gynecology

## 2014-08-23 NOTE — Telephone Encounter (Signed)
Patient is having pain and bleeding again. She states she talk with the nurse earlier and was told to call back if her problem got worse.

## 2014-08-23 NOTE — Telephone Encounter (Signed)
Spoke with patient. She called 3/30 and was feeling cramps and having vaginal spotting. She was subsequently scheduled for office visit with Dr. Sabra Heck for 4/1 for evaluation. Then, she called back on 3/31 and states that symptoms were resolved, no more cramping and dark brown spotting only and cancelled appointment. Patient with hx of IBS. Patient states she has not had a period since insertion of IUD. Denies bleeding at this time, denies spotting. Today, patient reports intermittent episodes of LLQ pain. She does not associate this pain with anything, was standing at work when noticed the pain. Has not tried any otc treatment. Patient has been pain free since 08/17/14 at last phone call until today. Denies urinary complaints, nausea, vomiting, fevers,  diarrhea and vaginal discharge.  Patient request evaluation for Friday only. Scheduled office visit for 08/25/14 with Regina Eck CNM. Patient is advised to return call with any increase in symptoms, heavy vaginal bleeding, fever or any other concern. Patient verbalized understanding of instructions and will call back prn. Routing to provider for final review. Patient agreeable to disposition. Will close encounter

## 2014-08-25 ENCOUNTER — Telehealth: Payer: Self-pay | Admitting: Certified Nurse Midwife

## 2014-08-25 ENCOUNTER — Ambulatory Visit: Payer: Commercial Managed Care - PPO | Admitting: Certified Nurse Midwife

## 2014-08-25 NOTE — Telephone Encounter (Signed)
Left message on voicemail to call and reschedule missed problem visit.

## 2014-08-29 NOTE — Telephone Encounter (Signed)
Left message to call Kaitlyn at [phone]. 

## 2014-08-29 NOTE — Telephone Encounter (Signed)
Patient called to reschedule her missed appointment from 08/25/14 for intermitant LLQ pain with Melvia Heaps, CNM. She says she can only come on a Friday and will see anyone.

## 2014-08-30 NOTE — Telephone Encounter (Signed)
Spoke with patient. She overslept for last week problem visit with c/o intermittent LLQ pain. Patient states this LLQ pain continues. States she notices the feelings at different times, and describes as "dull and sharp." Has not tried any otc treatment when the pain occurs. Denies vaginal bleeding, denies nausea, vomiting or diarrhea, denies dysuria or vaginal discharge. Unable to feel IUD strings, but has never been able to do so. Attempted to reschedule patient for evaluation, multiple date and time and provider options for appointment discussed and patient declines to schedule due to work schedule. She is agreeable to appointment for 09/04/14 at 1600 with Regina Eck CNM. She will need to confirm with her boss that she can leave work early to make this appointment. Advised patient if cannot keep this appointment to please call back and ask to speak with triage nurse to reschedule. She is also advised to call back with any concerning symptoms or if develops abdominal pain, fevers, nausea, vomiting. Patient verbalized understanding. Routing to provider for final review. Patient agreeable to disposition. Will close encounter

## 2014-09-04 ENCOUNTER — Ambulatory Visit (INDEPENDENT_AMBULATORY_CARE_PROVIDER_SITE_OTHER): Payer: Commercial Managed Care - PPO | Admitting: Certified Nurse Midwife

## 2014-09-04 ENCOUNTER — Encounter: Payer: Self-pay | Admitting: Certified Nurse Midwife

## 2014-09-04 VITALS — BP 94/60 | HR 76 | Temp 98.8°F | Resp 16 | Ht 64.0 in | Wt 214.0 lb

## 2014-09-04 DIAGNOSIS — B9689 Other specified bacterial agents as the cause of diseases classified elsewhere: Secondary | ICD-10-CM

## 2014-09-04 DIAGNOSIS — R109 Unspecified abdominal pain: Secondary | ICD-10-CM | POA: Diagnosis not present

## 2014-09-04 DIAGNOSIS — N760 Acute vaginitis: Secondary | ICD-10-CM

## 2014-09-04 DIAGNOSIS — A499 Bacterial infection, unspecified: Secondary | ICD-10-CM

## 2014-09-04 LAB — POCT URINALYSIS DIPSTICK
Bilirubin, UA: NEGATIVE
Blood, UA: NEGATIVE
Glucose, UA: NEGATIVE
Ketones, UA: NEGATIVE
Nitrite, UA: NEGATIVE
Protein, UA: NEGATIVE
Urobilinogen, UA: NEGATIVE
pH, UA: 7

## 2014-09-04 MED ORDER — METRONIDAZOLE 0.75 % VA GEL: 1.0000 | Freq: Two times a day (BID) | VAGINAL | Status: DC

## 2014-09-04 NOTE — Progress Notes (Signed)
24 y.o. Single white female G0P0000 here with complaint of cramping 2 weeks ago and when she started bleeding like period, all the pain and cramping resolved. Pain started again as before but has not noted any bleeding.  Contraception is Skyla IUD inserted in 03/30/13.Patient is not having any pain today. She described pain is very short. No STD screening needed. No partner change in a long time. Patient has changed personal products and now using [employer]. She now remembers this is when she started noticing ?vaginal change. Patient has had GI issues with constipation and has appointment with GI 09/14/14. No other health concerns today.    HPI. Pertinent to exam  Exam:   BP 94/60 mmHg  Pulse 76  Temp(Src) 98.8 F (37.1 C) (Oral)  Resp 16  Ht 5\' 4"  (1.626 m)  Wt 214 lb (97.07 kg)  BMI 36.72 kg/m2  LMP 08/16/2014 General appearance: alert, cooperative, appears stated age and no distress Skin warm and dry CVAT negative bilateral Abdomen:  soft, non-tender; bowel sounds normal; no masses,  no organomegaly negative for suprapubic Lymph:  no enlarged inguinal lymph nodes   Pelvic: External genitalia:  no lesions and normal female              Urethra: normal appearing urethra with no masses, tenderness or lesions              Bartholins and Skenes: Bartholin's, Urethra, Skene's normal                 Vagina: normal appearing vagina with watery grey discharge ph 5.0 wet prep taken              Cervix: normal appearance, IUD string visualized and non tender, no CMT Bimanual Exam:  Uterus:  uterus is normal size, shape, consistency and nontender                               Adnexa:    normal adnexa in size, nontender and no masses                               Rectovaginal: Confirms                               Anus:  defer exam  Wet prep: Positive for clue  A: Normal pelvic exam Contraception Skyla IUD string noted appropriate length, normal surveillance BV  P:Reviwed finding of  normal pelvic exam, IUD in place. Discussed expectations of bleeding with IUD and cramping that may occur. Warning signs given. Patient felt much better after having exam. Discussed finding of BV and etiology. Questions addressed regarding BV and treatment.  Rx Metrogel see order  Labs:  CBC with diff  Rv prn.

## 2014-09-04 NOTE — Patient Instructions (Signed)
Bacterial Vaginosis Bacterial vaginosis is a vaginal infection that occurs when the normal balance of bacteria in the vagina is disrupted. It results from an overgrowth of certain bacteria. This is the most common vaginal infection in women of childbearing age. Treatment is important to prevent complications, especially in pregnant women, as it can cause a premature delivery. CAUSES  Bacterial vaginosis is caused by an increase in harmful bacteria that are normally present in smaller amounts in the vagina. Several different kinds of bacteria can cause bacterial vaginosis. However, the reason that the condition develops is not fully understood. RISK FACTORS Certain activities or behaviors can put you at an increased risk of developing bacterial vaginosis, including:  Having a new sex partner or multiple sex partners.  Douching.  Using an intrauterine device (IUD) for contraception. Women do not get bacterial vaginosis from toilet seats, bedding, swimming pools, or contact with objects around them. SIGNS AND SYMPTOMS  Some women with bacterial vaginosis have no signs or symptoms. Common symptoms include:  Grey vaginal discharge.  A fishlike odor with discharge, especially after sexual intercourse.  Itching or burning of the vagina and vulva.  Burning or pain with urination. DIAGNOSIS  Your health care provider will take a medical history and examine the vagina for signs of bacterial vaginosis. A sample of vaginal fluid may be taken. Your health care provider will look at this sample under a microscope to check for bacteria and abnormal cells. A vaginal pH test may also be done.  TREATMENT  Bacterial vaginosis may be treated with antibiotic medicines. These may be given in the form of a pill or a vaginal cream. A second round of antibiotics may be prescribed if the condition comes back after treatment.  HOME CARE INSTRUCTIONS   Only take over-the-counter or prescription medicines as  directed by your health care provider.  If antibiotic medicine was prescribed, take it as directed. Make sure you finish it even if you start to feel better.  Do not have sex until treatment is completed.  Tell all sexual partners that you have a vaginal infection. They should see their health care provider and be treated if they have problems, such as a mild rash or itching.  Practice safe sex by using condoms and only having one sex partner. SEEK MEDICAL CARE IF:   Your symptoms are not improving after 3 days of treatment.  You have increased discharge or pain.  You have a fever. MAKE SURE YOU:   Understand these instructions.  Will watch your condition.  Will get help right away if you are not doing well or get worse. FOR MORE INFORMATION  Centers for Disease Control and Prevention, Division of STD Prevention: www.cdc.gov/std American Sexual Health Association (ASHA): www.ashastd.org  Document Released: 05/05/2005 Document Revised: 02/23/2013 Document Reviewed: 12/15/2012 ExitCare Patient Information 2015 [employer]. This information is not intended to replace advice given to you by your health care provider. Make sure you discuss any questions you have with your health care provider.  

## 2014-09-05 ENCOUNTER — Encounter: Payer: Self-pay | Admitting: Obstetrics & Gynecology

## 2014-09-05 ENCOUNTER — Telehealth: Payer: Self-pay | Admitting: Certified Nurse Midwife

## 2014-09-05 ENCOUNTER — Other Ambulatory Visit: Payer: Self-pay | Admitting: Certified Nurse Midwife

## 2014-09-05 ENCOUNTER — Encounter: Payer: Self-pay | Admitting: Certified Nurse Midwife

## 2014-09-05 DIAGNOSIS — R899 Unspecified abnormal finding in specimens from other organs, systems and tissues: Secondary | ICD-10-CM

## 2014-09-05 LAB — CBC WITH DIFFERENTIAL/PLATELET
Basophils Absolute: 0 10*3/uL (ref 0.0–0.1)
Basophils Relative: 0 % (ref 0–1)
Eosinophils Absolute: 0.1 10*3/uL (ref 0.0–0.7)
Eosinophils Relative: 1 % (ref 0–5)
HCT: 39.2 % (ref 36.0–46.0)
Hemoglobin: 12.9 g/dL (ref 12.0–15.0)
Lymphocytes Relative: 36 % (ref 12–46)
Lymphs Abs: 3.8 10*3/uL (ref 0.7–4.0)
MCH: 28.3 pg (ref 26.0–34.0)
MCHC: 32.9 g/dL (ref 30.0–36.0)
MCV: 86 fL (ref 78.0–100.0)
MPV: 9.4 fL (ref 8.6–12.4)
Monocytes Absolute: 0.6 10*3/uL (ref 0.1–1.0)
Monocytes Relative: 6 % (ref 3–12)
Neutro Abs: 6 10*3/uL (ref 1.7–7.7)
Neutrophils Relative %: 57 % (ref 43–77)
Platelets: 316 10*3/uL (ref 150–400)
RBC: 4.56 MIL/uL (ref 3.87–5.11)
RDW: 13.7 % (ref 11.5–15.5)
WBC: 10.6 10*3/uL — ABNORMAL HIGH (ref 4.0–10.5)

## 2014-09-05 NOTE — Progress Notes (Signed)
Reviewed personally.  M. Suzanne Eastyn Skalla, MD.  

## 2014-09-05 NOTE — Telephone Encounter (Signed)
Pt states she was seen yesterday and forgot work note. Requests we fax to her work [place] to the attention of [facility] @ [phone]. Confirmed pts appt yesterday 09/04/14. Letter completed and faxed.

## 2014-09-12 ENCOUNTER — Telehealth: Payer: Self-pay | Admitting: Certified Nurse Midwife

## 2014-09-12 NOTE — Telephone Encounter (Signed)
Spoke with patient. Discussed result note from Regina Eck CNM. Confirmed with patient she is to take daily multivitamin. She is scheduled for lab appointment 09/15/14.Routing to provider for final review. Patient agreeable to disposition. Will close encounter

## 2014-09-12 NOTE — Telephone Encounter (Signed)
Pt has appt on Friday for a lab and has questions about it regarding what medications she can take before.

## 2014-09-15 ENCOUNTER — Other Ambulatory Visit (INDEPENDENT_AMBULATORY_CARE_PROVIDER_SITE_OTHER): Payer: Commercial Managed Care - PPO

## 2014-09-15 DIAGNOSIS — R899 Unspecified abnormal finding in specimens from other organs, systems and tissues: Secondary | ICD-10-CM

## 2014-09-15 LAB — CBC WITH DIFFERENTIAL/PLATELET
Basophils Absolute: 0 10*3/uL (ref 0.0–0.1)
Basophils Relative: 0 % (ref 0–1)
Eosinophils Absolute: 0.2 10*3/uL (ref 0.0–0.7)
Eosinophils Relative: 2 % (ref 0–5)
HCT: 38.7 % (ref 36.0–46.0)
Hemoglobin: 13 g/dL (ref 12.0–15.0)
Lymphocytes Relative: 35 % (ref 12–46)
Lymphs Abs: 3.4 10*3/uL (ref 0.7–4.0)
MCH: 28.6 pg (ref 26.0–34.0)
MCHC: 33.6 g/dL (ref 30.0–36.0)
MCV: 85.1 fL (ref 78.0–100.0)
MPV: 9.6 fL (ref 8.6–12.4)
Monocytes Absolute: 0.6 10*3/uL (ref 0.1–1.0)
Monocytes Relative: 6 % (ref 3–12)
Neutro Abs: 5.5 10*3/uL (ref 1.7–7.7)
Neutrophils Relative %: 57 % (ref 43–77)
Platelets: 289 10*3/uL (ref 150–400)
RBC: 4.55 MIL/uL (ref 3.87–5.11)
RDW: 13.7 % (ref 11.5–15.5)
WBC: 9.7 10*3/uL (ref 4.0–10.5)

## 2015-03-27 DIAGNOSIS — E559 Vitamin D deficiency, unspecified: Secondary | ICD-10-CM | POA: Insufficient documentation

## 2015-06-08 ENCOUNTER — Other Ambulatory Visit: Payer: Self-pay | Admitting: Gastroenterology

## 2015-06-08 DIAGNOSIS — R110 Nausea: Secondary | ICD-10-CM

## 2015-06-22 ENCOUNTER — Ambulatory Visit (HOSPITAL_COMMUNITY): Payer: Commercial Managed Care - PPO

## 2015-06-29 ENCOUNTER — Encounter (HOSPITAL_COMMUNITY)
Admission: RE | Admit: 2015-06-29 | Discharge: 2015-06-29 | Disposition: A | Discharge status: Home / Self Care | Payer: Commercial Managed Care - PPO | Source: Ambulatory Visit | Attending: Gastroenterology | Admitting: Gastroenterology

## 2015-06-29 DIAGNOSIS — R110 Nausea: Secondary | ICD-10-CM | POA: Diagnosis not present

## 2015-06-29 MED ORDER — TECHNETIUM TC 99M MEBROFENIN IV KIT
5.2000 | PACK | Freq: Once | INTRAVENOUS | Status: AC | PRN
  Administered 2015-06-29: 5 via INTRAVENOUS

## 2015-07-18 ENCOUNTER — Telehealth: Payer: Self-pay | Admitting: *Deleted

## 2015-07-18 NOTE — Telephone Encounter (Signed)
Patient is past due for breast imaging due 01/2015.  Recall Notes:  Left breast ultrasound in 6 months.  Last MMG:  08/04/14 Targeted ultrasound of the left breast was performed demonstrating an oval well circumscribed gently lobulated mass at 5 o'clock 2 cm from the nipple measuring 1.6 x 1.0 x 1.5 cm. This demonstrates imaging features consistent with a fibroadenoma.  IMPRESSION: Probably benign left breast mass.  Pt has not scheduled or completed recommended breast imaging.  Due 01/2015.  Please call patient to schedule.  [facility].

## 2015-07-18 NOTE — Telephone Encounter (Signed)
Spoke to patient & she states she has had a lot come up here recently & has not been able to have it done. Pt is going to call the breast center & have them check with her insurance. Pt aware of importance of followup. Pt also has aex scheduled with Johny Shock, CNM on 08-24-15.

## 2015-08-17 ENCOUNTER — Encounter: Payer: Self-pay | Admitting: Obstetrics and Gynecology

## 2015-08-17 ENCOUNTER — Ambulatory Visit (INDEPENDENT_AMBULATORY_CARE_PROVIDER_SITE_OTHER): Payer: Commercial Managed Care - PPO | Admitting: Obstetrics and Gynecology

## 2015-08-17 VITALS — BP 98/60 | HR 80 | Resp 16 | Wt 215.0 lb

## 2015-08-17 DIAGNOSIS — N760 Acute vaginitis: Secondary | ICD-10-CM

## 2015-08-17 NOTE — Addendum Note (Signed)
Addended by: Dorothy Spark on: 08/17/2015 02:39 PM   Modules accepted: Orders

## 2015-08-17 NOTE — Progress Notes (Signed)
Patient ID: Emily Chavez, female   DOB: 08/28/1990, 25 y.o.   MRN: [id number] GYNECOLOGY  VISIT   HPI: 25 y.o.   Single  Caucasian  female   G0P0000 with No LMP recorded. Patient is not currently having periods (Reason: IUD).   here c/o a yellow vaginal discharge X 5 days. The d/c is a mucous, yellow/brown. Slight pruritus, no burning, no irritation, no odor. Sexually active, same partner x 4 years, they just broke up. Not using condoms. She has a skyla IUD.  GYNECOLOGIC HISTORY: No LMP recorded. Patient is not currently having periods (Reason: IUD). Contraception:IUD Menopausal hormone therapy: none         OB History    Gravida Para Term Preterm AB TAB SAB Ectopic Multiple Living   0 0 0 0 0 0 0 0 0 0          Patient Active Problem List   Diagnosis Date Noted  . Bipolar 1 disorder [facility])     Past Medical History  Diagnosis Date  . Depression   . Bipolar 1 disorder ([place])   . Concussion 09/2012  . Asthma     exercise induced  . Migraines     with aura    Past Surgical History  Procedure Laterality Date  . Appendectomy  age 8  . Tympanostomy tube placement    . Skyla insertion  03-30-13    Current Outpatient Prescriptions  Medication Sig Dispense Refill  . divalproex (DEPAKOTE) 500 MG DR tablet Take by mouth. Take 500mg  one day and 1,000 the next day, rotating    . doxycycline (MONODOX) 100 MG capsule   6  . fexofenadine (ALLEGRA) 180 MG tablet Take 180 mg by mouth daily.    . Levonorgestrel (SKYLA) 13.5 MG IUD 1 each by Intrauterine route continuous.     . Vitamin D, Ergocalciferol, (DRISDOL) 50000 units CAPS capsule TAKE 1 TABLET TWICE A WEEK  3   No current facility-administered medications for this visit.     ALLERGIES: Iodine; Iodinated diagnostic agents; Cortizone-10; and Lamictal  Family History  Problem Relation Age of Onset  . Pancreatic cancer Maternal Grandmother   . Lung cancer Maternal Grandmother   . Liver cancer Maternal Grandmother   .  Hypertension Maternal Grandmother   . Mitral valve prolapse Maternal Grandmother   . Thyroid disease Mother   . Heart disease Paternal Grandmother   . Hypertension Paternal Grandfather   . Parkinson's disease Paternal Grandfather   . Alzheimer's disease Paternal Grandfather     Social History   Social History  . Marital Status: Single    Spouse Name: N/A  . Number of Children: N/A  . Years of Education: N/A   Occupational History  . Not on file.   Social History Main Topics  . Smoking status: Never Smoker   . Smokeless tobacco: Never Used  . Alcohol Use: No  . Drug Use: No  . Sexual Activity:    Partners: Male    Birth Control/ Protection: IUD     Comment: skyla   Other Topics Concern  . Not on file   Social History Narrative    Review of Systems  Constitutional: Negative.   HENT: Negative.   Eyes: Negative.   Respiratory: Negative.   Cardiovascular: Negative.   Gastrointestinal: Negative.   Genitourinary:       Yellow vaginal discharge   Musculoskeletal: Negative.   Skin: Negative.   Neurological: Negative.   Endo/Heme/Allergies: Negative.   Psychiatric/Behavioral: Negative.  PHYSICAL EXAMINATION:    BP 98/60 mmHg  Pulse 80  Resp 16  Wt 215 lb (97.523 kg)    General appearance: alert, cooperative and appears stated age  Pelvic: External genitalia:  no lesions or erythema              Urethra:  normal appearing urethra with no masses, tenderness or lesions              Bartholins and Skenes: normal                 Vagina: normal appearing vagina with normal color and discharge, no lesions              Cervix: no lesions, IUD string 3 cm  Chaperone was present for exam.  Wet prep: no clue, no trich, ++ wbc KOH: no yeast PH: 4   ASSESSMENT Mild vulvovaginitis c/o, negative vaginal slides and exam Just ended a 4 year relationship, encouraged to use condoms for STD protection    PLAN Discussed vulvar skin care Affirm probe sent off Use  Vaseline for mild vulvar c/o F/U for an annual exam next week   An After Visit Summary was printed and given to the patient.  15 minutes face to face time of which over 50% was spent in counseling.

## 2015-08-18 LAB — WET PREP BY MOLECULAR PROBE
Candida species: NEGATIVE
Gardnerella vaginalis: NEGATIVE
Trichomonas vaginosis: NEGATIVE

## 2015-08-24 ENCOUNTER — Ambulatory Visit (INDEPENDENT_AMBULATORY_CARE_PROVIDER_SITE_OTHER): Payer: Commercial Managed Care - PPO | Admitting: Certified Nurse Midwife

## 2015-08-24 ENCOUNTER — Encounter: Payer: Self-pay | Admitting: Certified Nurse Midwife

## 2015-08-24 VITALS — BP 102/62 | HR 70 | Resp 16 | Ht 63.75 in | Wt 215.0 lb

## 2015-08-24 DIAGNOSIS — Z01419 Encounter for gynecological examination (general) (routine) without abnormal findings: Secondary | ICD-10-CM | POA: Diagnosis not present

## 2015-08-24 DIAGNOSIS — Z30431 Encounter for routine checking of intrauterine contraceptive device: Secondary | ICD-10-CM

## 2015-08-24 DIAGNOSIS — D242 Benign neoplasm of left breast: Secondary | ICD-10-CM | POA: Diagnosis not present

## 2015-08-24 NOTE — Progress Notes (Signed)
25 y.o. G0P0000 Single  Caucasian Fe here for annual exam. Periods scant to none, finishing period now. Recent break up with partner of 4 years.  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 Many adjustments, coping as best she can. Working on weight loss.  Sees PCP aex and as needed, also manages  Depakote.  Sees Dr. Collene Mares for IBS management, much better now. Declines STD screening today. Partner had HPV and she has never noticed any changes. Patient took Gardasil vaccine. No other health issues today.  Patient's last menstrual period was 08/17/2015.          Sexually active: Yes.    The current method of family planning is IUD.    Exercising: No.  exercise Smoker:  occ  Health Maintenance: Pap:  07-28-14 neg MMG:08-04-14  U/s left breast needed 3mth f/u Colonoscopy:  2016 BMD:   none TDaP:  2011 Shingles: no Pneumonia: no Hep C and HIV: not done Labs: none Self breast exam: not done   reports that she has been smoking.  She has never used smokeless tobacco. She reports that she drinks alcohol. She reports that she does not use illicit drugs.  Past Medical History  Diagnosis Date  . Depression   . Bipolar 1 disorder ([employer])   . Concussion 09/2012  . Asthma     exercise induced  . Migraines     with aura  . IBS (irritable bowel syndrome)     Past Surgical History  Procedure Laterality Date  . Appendectomy  age 25  . Tympanostomy tube placement    . Skyla insertion  03-30-13    Current Outpatient Prescriptions  Medication Sig Dispense Refill  . divalproex (DEPAKOTE) 500 MG DR tablet Take by mouth. Take 500mg  one day and 1,000 the next day, rotating    . fexofenadine (ALLEGRA) 180 MG tablet Take 180 mg by mouth daily.    . Levonorgestrel (SKYLA) 13.5 MG IUD 1 each by Intrauterine route continuous.     . Vitamin D, Ergocalciferol, (DRISDOL) 50000 units CAPS capsule TAKE 1 TABLET TWICE A WEEK  3   No current facility-administered  medications for this visit.    Family History  Problem Relation Age of Onset  . Pancreatic cancer Maternal Grandmother   . Lung cancer Maternal Grandmother   . Liver cancer Maternal Grandmother   . Hypertension Maternal Grandmother   . Mitral valve prolapse Maternal Grandmother   . Thyroid disease Mother   . Heart disease Paternal Grandmother   . Hypertension Paternal Grandfather   . Parkinson's disease Paternal Grandfather   . Alzheimer's disease Paternal Grandfather     ROS:  Pertinent items are noted in HPI.  Otherwise, a comprehensive ROS was negative.  Exam:   BP 102/62 mmHg  Pulse 70  Resp 16  Ht 5' 3.75" (1.619 m)  Wt 215 lb (97.523 kg)  BMI 37.21 kg/m2  LMP 08/17/2015 Height: 5' 3.75" (161.9 cm) Ht Readings from Last 3 Encounters:  08/24/15 5' 3.75" (1.619 m)  09/04/14 5\' 4"  (1.626 m)  07/28/14 5\' 4"  (1.626 m)    General appearance: alert, cooperative and appears stated age Head: Normocephalic, without obvious abnormality, atraumatic Neck: no adenopathy, supple, symmetrical, trachea midline and thyroid normal to inspection and palpation Lungs: clear to auscultation bilaterally Breasts: normal appearance, no masses or tenderness, No nipple retraction or dimpling, No nipple discharge or bleeding, No axillary or supraclavicular adenopathy Known fibroadenoma noted again at 5  o'clock, 1 FB from aerola,, non tender,mobile Heart: regular rate and rhythm Abdomen: soft, non-tender; no masses,  no organomegaly Extremities: extremities normal, atraumatic, no cyanosis or edema Skin: Skin color, texture, turgor normal. No rashes or lesions Lymph nodes: Cervical, supraclavicular, and axillary nodes normal. No abnormal inguinal nodes palpated Neurologic: Grossly normal   Pelvic: External genitalia:  no lesions              Urethra:  normal appearing urethra with no masses, tenderness or lesions              Bartholin's and Skene's: normal                 Vagina: normal  appearing vagina with normal color and discharge, no lesions              Cervix: no cervical motion tenderness, no lesions, nulliparous appearance and IUD string noted in cervix              Pap taken: No. 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 Questions addressed and information given. Warning signs with IUD given and need to advise.  Stressed importance of follow up, patient has reminder card and will call.  Continue SBE.  Pap smear as above not taken   counseled on breast self exam, STD prevention, HIV risk factors and prevention, adequate intake of calcium and vitamin D, diet and exercise  return annually or prn  An After Visit Summary was printed and given to the patient.

## 2015-08-24 NOTE — Patient Instructions (Signed)
Human Papillomavirus Human papillomavirus (HPV) is the most common sexually transmitted infection (STI). It is easy to pass it from person to person (contagious). HPV can cause cervical cancer, anal cancer, and genital warts. The genital warts can be seen and felt. Also, there may be wartlike regions in the throat. HPV may not have any symptoms. It is possible to have HPV for a long time and not know it. You may pass HPV on to others without knowing it.  HOME CARE   Take medicines as told by your doctor.  Use over-the-counter creams for itching as told by your doctor.  Keep all follow-up visits. Make sure to get Pap tests as told by your doctor.  Do not touch or scratch the warts.  Do not treat genital warts with medicines used for treating hand warts.  Do not have sex while you are getting treatment.  Do not douche or use tampons during treatment of HPV.  Tell your sex partner about your infection because he or she may also need treatment.  If you get pregnant, tell your doctor that you had HPV. Your doctor will watch your pregnancy closely. This is important to keep your baby safe.  After treatment, use condoms during sex to prevent future infections.  Have only one sex partner.  Have a sex partner who does not have other sex partners. GET HELP IF:   The treated skin is red, swollen, or painful.  You have a fever.  You feel ill.  You feel lumps or pimple-like areas in and around your genital area.  You have bleeding of the vagina or the area that was treated.  You have pain during sex. MAKE SURE YOU:  Understand these instructions.  Will watch your condition.  Will get help if you are not doing well or get worse.   This information is not intended to replace advice given to you by your health care provider. Make sure you discuss any questions you have with your health care provider.   Document Released: 04/17/2008 Document Revised: 05/26/2014 Document Reviewed:  08/10/2013 Elsevier Interactive Patient Education 2016 [employer]. Intrauterine Device Information An intrauterine device (IUD) is inserted into your uterus to prevent pregnancy. There are two types of IUDs available:   Copper IUD--This type of IUD is wrapped in copper wire and is placed inside the uterus. Copper makes the uterus and fallopian tubes produce a fluid that kills sperm. The copper IUD can stay in place for 10 years.  Hormone IUD--This type of IUD contains the hormone progestin (synthetic progesterone). The hormone thickens the cervical mucus and prevents sperm from entering the uterus. It also thins the uterine lining to prevent implantation of a fertilized egg. The hormone can weaken or kill the sperm that get into the uterus. One type of hormone IUD can stay in place for 5 years, and another type can stay in place for 3 years. Your health care provider will make sure you are a good candidate for a contraceptive IUD. Discuss with your health care provider the possible side effects.  ADVANTAGES OF AN INTRAUTERINE DEVICE  IUDs are highly effective, reversible, long acting, and low maintenance.   There are no estrogen-related side effects.   An IUD can be used when breastfeeding.   IUDs are not associated with weight gain.   The copper IUD works immediately after insertion.   The hormone IUD works right away if inserted within 7 days of your period starting. You will need to use a  backup method of birth control for 7 days if the hormone IUD is inserted at any other time in your cycle.  The copper IUD does not interfere with your female hormones.   The hormone IUD can make heavy menstrual periods lighter and decrease cramping.   The hormone IUD can be used for 3 or 5 years.   The copper IUD can be used for 10 years. DISADVANTAGES OF AN INTRAUTERINE DEVICE  The hormone IUD can be associated with irregular bleeding patterns.   The copper IUD can make your  menstrual flow heavier and more painful.   You may experience cramping and vaginal bleeding after insertion.    This information is not intended to replace advice given to you by your health care provider. Make sure you discuss any questions you have with your health care provider.   Document Released: 04/08/2004 Document Revised: 01/05/2013 Document Reviewed: 10/24/2012 Elsevier Interactive Patient Education [employer].

## 2015-09-04 NOTE — Progress Notes (Signed)
Encounter reviewed Ellyn Rubiano, MD   

## 2015-09-20 ENCOUNTER — Encounter: Payer: Self-pay | Admitting: *Deleted

## 2015-09-20 NOTE — Telephone Encounter (Signed)
Patient has not scheduled recommended breast imaging.  Pt was reminded of importance at annual exam on 08/24/15.  Letter created.  Please advise recall.

## 2015-09-25 NOTE — Telephone Encounter (Signed)
OK to send letter and remove from recall. 

## 2015-09-27 NOTE — Telephone Encounter (Signed)
Recall letter signed by Dr. Sabra Heck, marked as sent and mailed.  Pt removed from current recall.  Closing encounter.

## 2015-10-05 ENCOUNTER — Encounter: Payer: Self-pay | Admitting: Certified Nurse Midwife

## 2015-10-05 ENCOUNTER — Ambulatory Visit (INDEPENDENT_AMBULATORY_CARE_PROVIDER_SITE_OTHER): Payer: Commercial Managed Care - PPO | Admitting: Certified Nurse Midwife

## 2015-10-05 ENCOUNTER — Telehealth: Payer: Self-pay | Admitting: Obstetrics & Gynecology

## 2015-10-05 VITALS — BP 110/62 | HR 68 | Resp 16 | Ht 63.75 in | Wt 210.0 lb

## 2015-10-05 DIAGNOSIS — Z113 Encounter for screening for infections with a predominantly sexual mode of transmission: Secondary | ICD-10-CM | POA: Diagnosis not present

## 2015-10-05 DIAGNOSIS — Z30431 Encounter for routine checking of intrauterine contraceptive device: Secondary | ICD-10-CM | POA: Diagnosis not present

## 2015-10-05 DIAGNOSIS — N926 Irregular menstruation, unspecified: Secondary | ICD-10-CM | POA: Diagnosis not present

## 2015-10-05 LAB — POCT URINE PREGNANCY: Preg Test, Ur: NEGATIVE

## 2015-10-05 NOTE — Telephone Encounter (Signed)
Patient has an IUD  And has been spotting for over a month.

## 2015-10-05 NOTE — Progress Notes (Signed)
Subjective:     Patient ID: Emily Chavez, female   DOB: 08-07-1990, 25 y.o.   MRN: [id number]  HPI Patient here complaining of spotting with Skyla IUD. Has not had periods with IUD other than spotting. Has seemed to increase over the past month. Patient has had 15 pound weight loss over the past 2 months. Denies any heavy bleeding or clotting, vaginal odor or pain. Uses one tampon over 3 hour period when occurs. Minimal bleeding today. Patient also concerned about HPV. Partner has HPV on penis and she is worried and would like to have genital area checked to see if any  Present. Also has had oral sexual activity. Has not noted any oral changes in mouth. Aex one month ago , none noted on exam and she has not noted any areas.  Agreeable to vaginal STD screening if needed. No other health concerns.   Review of Systems  Constitutional: Negative.   Gastrointestinal: Negative.   Genitourinary: Positive for vaginal bleeding and vaginal discharge. Negative for vaginal pain.  Skin: Negative.    Pertinent to history  Hgb. 13.2    Objective:   Physical Exam  Constitutional: She is oriented to person, place, and time. She appears well-developed and well-nourished.  Neck: No thyromegaly present.  Abdominal: Soft. There is no tenderness.  Genitourinary: Uterus normal. There is no rash, tenderness or lesion on the right labia. There is no rash, tenderness or lesion on the left labia. Uterus is not tender. Cervix exhibits no motion tenderness and no friability. Right adnexum displays no mass, no tenderness and no fullness. Left adnexum displays no mass, no tenderness and no fullness. No tenderness or bleeding in the vagina. Vaginal discharge found.  IUD string noted in cervix, no blood noted. Specimens collected    Lymphadenopathy:       Right: No inguinal adenopathy present.       Left: No inguinal adenopathy present.  Neurological: She is alert and oriented to person, place, and time.  Skin: Skin is  warm and dry.  Psychiatric: She has a normal mood and affect. Her behavior is normal. Thought content normal.       Assessment:     Normal pelvic exam No genital warts noted in vaginal area History of possible exposure with partner who has genital wart. R/O Gc,chlamydia, vaginal pathogens Skyla IUD with normal bleeding profile, due for replacement 11/17      Plan:     Discussed normal pelvic exam finding and reassured no visible genital warts noted. Discussed etiology of genital warts and transmission genitally, orally or rectally. Discussed condom use for STD protection as well. Questions addressed. Discussed normal bleeding profile with Skyla IUD and when reaching year of replacement may have periods as she had scant to none before. If continues to be a concern can replace early. Lab:Gc,Chlamydia, affirm Patient to keep menses calendar of bleeding so can advise . Warning signs of bleeding given.  Patient felt better after exam.  Rv prn as above

## 2015-10-05 NOTE — Telephone Encounter (Signed)
Spoke with patient. Patient states that she has been experiencing intermittent spotting for over 1 month. Patient has a Skyla IUD that was placed on 03/30/2013. Due for removal 03/2016. Patient is experiencing cramping "like I am on my cycle." Denies any pelvic or abdominal pain, fever, or chills. Patient states she has never been able to feel her IUD strings so is unsure if anything has changed. Has taken UPT which was negative. Patient has concerns for risk of pregnancy. Advised she will need to be seen in the office for further evaluation. Patient is agreeable. Appointment scheduled for today 10/05/2015 at 2 pm with Melvia Heaps CNM. Agreeable to date and time.  Routing to provider for final review. Patient agreeable to disposition. Will close encounter.

## 2015-10-05 NOTE — Patient Instructions (Signed)
Genital Warts Genital warts are a common STD (sexually transmitted disease). They may appear as small bumps on the tissues of the genital area or anal area. Sometimes, they can become irritated and cause pain. Genital warts are easily passed to other people through sexual contact. Getting treatment is important because genital warts can lead to other problems. In females, the virus that causes genital warts may increase the risk of cervical cancer. CAUSES Genital warts are caused by a virus that is called human papillomavirus (HPV). HPV is spread by having unprotected sex with an infected person. It can be spread through vaginal, anal, and oral sex. Many people do not know that they are infected. They may be infected for years without problems. However, even if they do not have problems, they can pass the infection to their sexual partners. RISK FACTORS Genital warts are more likely to develop in:  People who have unprotected sex.  People who have multiple sexual partners.  People who become sexually active before they are 25 years of age.  Men who are not circumcised.  Women who have a female sexual partner who is not circumcised.  People who have a weakened body defense system (immune system) due to disease or medicine.  People who smoke. SYMPTOMS Symptoms of genital warts include:  Small growths in the genital area or anal area. These warts often grow in clusters.  Itching and irritation in the genital area or anal area.  Bleeding from the warts.  Painful sexual intercourse. DIAGNOSIS Genital warts can usually be diagnosed from their appearance on the vagina, vulva, penis, perineum, anus, or rectum. Tests may also be done, such as:  Biopsy. A tissue sample is removed so it can be looked at under a microscope.  Colposcopy. In females, a magnifying tool is used to examine the vagina and cervix. Certain solutions may be used to make the HPV cells change color so they can be seen more  easily.  A Pap test in females.  Tests for other STDs. TREATMENT Treatment for genital warts may include:  Applying prescription medicines to the warts. These may be solutions or creams.  Freezing the warts with liquid nitrogen (cryotherapy).  Burning the warts with:  Laser treatment.  An electrified probe (electrocautery).  Injecting a substance (Candida antigen or Trichophyton antigen) into the warts to help the body's immune system to fight off the warts.  Interferon injections.  Surgery to remove the warts. HOME CARE INSTRUCTIONS Medicines  Apply over-the-counter and prescription medicines only as told by your health care provider.  Do not treat genital warts with medicines that are used for treating hand warts.  Talk with your health care provider about using over-the-counter anti-itch creams. General Instructions  Do not touch or scratch the warts.  Do not have sex until your treatment has been completed.  Tell your current and past sexual partners about your condition because they may also need treatment.  Keep all follow-up visits as told by your health care provider. This is important.  After treatment, use condoms during sex to prevent future infections. Other Instructions for Women  Women who have genital warts might need increased screening for cervical cancer. This type of cancer is slow growing and can be cured if it is found early. Chances of developing cervical cancer are increased with HPV.  If you become pregnant, tell your health care provider that you have had HPV. Your health care provider will monitor you closely during pregnancy to be sure that your  baby is safe. PREVENTION Talk with your health care provider about getting the HPV vaccines. These vaccines prevent some HPV infections and cancers. It is recommended that the vaccine be given to males and females who are 9-26 years of age. It will not work if you already have HPV, and it is not  recommended for pregnant women. SEEK MEDICAL CARE IF:  You have redness, swelling, or pain in the area of the treated skin.  You have a fever.  You feel generally ill.  You feel lumps in and around your genital area or anal area.  You have bleeding in your genital area or anal area.  You have pain during sexual intercourse.   This information is not intended to replace advice given to you by your health care provider. Make sure you discuss any questions you have with your health care provider.   Document Released: 05/02/2000 Document Revised: 01/24/2015 Document Reviewed: 07/31/2014 Elsevier Interactive Patient Education 2016 Elsevier Inc.  

## 2015-10-06 LAB — WET PREP BY MOLECULAR PROBE
Candida species: NEGATIVE
Gardnerella vaginalis: NEGATIVE
Trichomonas vaginosis: NEGATIVE

## 2015-10-06 NOTE — Progress Notes (Signed)
Reviewed personally.  M. Suzanne Kimbella Heisler, MD.  

## 2015-10-10 LAB — IPS N GONORRHOEA AND CHLAMYDIA BY PCR

## 2015-11-01 ENCOUNTER — Ambulatory Visit (INDEPENDENT_AMBULATORY_CARE_PROVIDER_SITE_OTHER): Payer: Commercial Managed Care - PPO | Admitting: Certified Nurse Midwife

## 2015-11-01 ENCOUNTER — Encounter: Payer: Self-pay | Admitting: Certified Nurse Midwife

## 2015-11-01 VITALS — BP 102/68 | HR 72 | Resp 16 | Ht 63.75 in | Wt 214.0 lb

## 2015-11-01 DIAGNOSIS — N938 Other specified abnormal uterine and vaginal bleeding: Secondary | ICD-10-CM | POA: Diagnosis not present

## 2015-11-01 DIAGNOSIS — Z30431 Encounter for routine checking of intrauterine contraceptive device: Secondary | ICD-10-CM | POA: Diagnosis not present

## 2015-11-01 LAB — CBC WITH DIFFERENTIAL/PLATELET
Basophils Absolute: 0 cells/uL (ref 0–200)
Basophils Relative: 0 %
Eosinophils Absolute: 104 cells/uL (ref 15–500)
Eosinophils Relative: 1 %
HCT: 40.1 % (ref 35.0–45.0)
Hemoglobin: 13.7 g/dL (ref 11.7–15.5)
Lymphocytes Relative: 26 %
Lymphs Abs: 2704 cells/uL (ref 850–3900)
MCH: 29.5 pg (ref 27.0–33.0)
MCHC: 34.2 g/dL (ref 32.0–36.0)
MCV: 86.2 fL (ref 80.0–100.0)
MPV: 10.1 fL (ref 7.5–12.5)
Monocytes Absolute: 416 cells/uL (ref 200–950)
Monocytes Relative: 4 %
Neutro Abs: 7176 cells/uL (ref 1500–7800)
Neutrophils Relative %: 69 %
Platelets: 295 10*3/uL (ref 140–400)
RBC: 4.65 MIL/uL (ref 3.80–5.10)
RDW: 13.5 % (ref 11.0–15.0)
WBC: 10.4 10*3/uL (ref 3.8–10.8)

## 2015-11-01 LAB — POCT URINE PREGNANCY: Preg Test, Ur: NEGATIVE

## 2015-11-01 NOTE — Progress Notes (Signed)
Subjective:     Patient ID: Emily Chavez, white g0p0 female   DOB: 1991/03/11, 25 y.o.  Here with complaint of increase bleeding with Skyla IUD. Due for removal 11/17.  MRN: [id number]  HPI Patient complaining of bleeding return after having Skyla for 2 1/12 years. Had amenorrhea "that was great". Bleeding similar to her previous periods prior to IUD insertion. Previous OCP use with missed pills. Desires IUD due to longterm contraception. Used one tampon all night and had bleeding on underwear and increase with standing and then normal. Occasional cramping as she had with previous periods. Denies fever, chills or abdominal pain. Had 3 wisdom teeth removed 2 days ago, on Pen V K 500 mg BID x 7 days. Has taken Advil 600 mg twice in last 24 hours. Ready to have normal bleeding with IUD again. Patient has gained about 15 pounds in past year. No other health issues today.   Review of Systems  Constitutional: Negative.   Gastrointestinal: Negative for nausea and abdominal pain.  Genitourinary: Positive for vaginal bleeding. Negative for dysuria, urgency, frequency, vaginal discharge, vaginal pain and pelvic pain.  Skin: Negative.   Neurological: Negative.        Objective:   Physical Exam  Constitutional: She is oriented to person, place, and time. She appears well-developed and well-nourished.  Abdominal: Soft. She exhibits no distension and no mass. There is no tenderness. There is no rebound and no guarding.  Genitourinary: Uterus normal. Pelvic exam was performed with patient supine. There is no rash, tenderness or lesion on the right labia. There is no rash, tenderness or lesion on the left labia. Uterus is not enlarged and not tender. Cervix exhibits no motion tenderness and no discharge. Right adnexum displays no mass, no tenderness and no fullness. Left adnexum displays no mass, no tenderness and no fullness. There is bleeding in the vagina.    Lymphadenopathy:       Right: No inguinal  adenopathy present.       Left: No inguinal adenopathy present.  Neurological: She is alert and oriented to person, place, and time.  Skin: Skin is warm and dry.  Psychiatric: She has a normal mood and affect. Her behavior is normal. Judgment and thought content normal.   Scant bleeding noted from cervix only, none in vaginal vault Declines GC/chlamydia screening again negative one month ago and no partner change.    Assessment:     Normal pelvic exam Skyla IUD with return of menses type bleeding after long term amenorrhea  Replacement due 11/17 POCT UPT negative    Plan:     Discussed normal pelvic exam and IUD noted normal exam with string length. Discussed period type bleeding which is normal with IUD. Also discussed weight change can influence bleeding profile. Discussed sometimes will occur with getting close to replacement time of IUD. Discussed possible early replacement and possible Kyleena for longer use of 5 years. Patient would like earlier replacement to hopefully have amenorrhea return. Discussed this might not be her bodies response, but with previous history of very possible.  Order to placed. Patient is taking antibiotic with teeth extraction so if slight inflammatory response this may help. Discussed Motrin 800 mg every 7 hours over the next 24-48 hours may decrease bleeding, which is not excessive,per appearance or description. Reassured. Lab CBC with diff. Warnng signs with IUD reviewed and will need to advise. Patient will be called with insurance infor and scheduled for removal and reinsertion with Possible Kyleena with  MD.  Rv prn , as above

## 2015-11-01 NOTE — Patient Instructions (Signed)

## 2015-11-05 NOTE — Progress Notes (Signed)
Encounter reviewed by Dr. Brook Amundson C. Silva.  

## 2015-11-07 ENCOUNTER — Telehealth: Payer: Self-pay | Admitting: Certified Nurse Midwife

## 2015-11-07 NOTE — Telephone Encounter (Signed)
Call to patient to assist with scheduling appointment for IUD removal and reinsertion. Left message to call back.

## 2015-11-07 NOTE — Telephone Encounter (Signed)
Spoke with patient regarding benefits for IUD

## 2015-11-08 NOTE — Telephone Encounter (Signed)
Patient is returning a call to [facility] regarding scheduling her IUD removal and insertion.

## 2015-11-08 NOTE — Telephone Encounter (Signed)
I would plan for Cytotec 200 mcg pv the evening prior to insertion and the am of the insertion. Dispense:  2 tabs.  RF none.

## 2015-11-08 NOTE — Telephone Encounter (Signed)
Spoke to patient. IUD removal reinsertion with possible Kyleena scheduled for 11-16-15 with Dr Quincy Simmonds. Procedure date based on patient preference and work schedule.  Advised will review with Dr Quincy Simmonds for additional instructions and call her back.  G0 P0, with Skyla in place due for change in November 2017. Desires to replace now due to BTB.    Does she need any pre-medication?

## 2015-11-08 NOTE — Telephone Encounter (Signed)
Call to patient. Left message to call back.  

## 2015-11-09 ENCOUNTER — Ambulatory Visit: Payer: Commercial Managed Care - PPO | Admitting: Obstetrics & Gynecology

## 2015-11-12 MED ORDER — MISOPROSTOL 200 MCG PO TABS: ORAL_TABLET | ORAL | Status: DC

## 2015-11-12 NOTE — Telephone Encounter (Signed)
Call to patient. Advised of instructions from Dr Quincy Simmonds regarding Cytotec. Also reminded of Motrin 800 mg one hour prior with food.  Encounter closed.

## 2015-11-13 ENCOUNTER — Telehealth: Payer: Self-pay | Admitting: Obstetrics and Gynecology

## 2015-11-13 NOTE — Telephone Encounter (Signed)
Patient returned call.  All benefit questions have been answered. Patient understands and is agreeable. Patient is scheduled 11/16/15 with Dr Quincy Simmonds. Patient had no additional questions. Ok to close

## 2015-11-13 NOTE — Telephone Encounter (Signed)
Returned call to patient regarding benefit questions. Left message of machine requesting a return call.

## 2015-11-15 ENCOUNTER — Telehealth: Payer: Self-pay | Admitting: Obstetrics and Gynecology

## 2015-11-15 NOTE — Telephone Encounter (Signed)
Patient called back

## 2015-11-15 NOTE — Telephone Encounter (Signed)
Ok to keep appointment for IUD exchange tomorrow.  Take the Cytotec orally instead of vaginally.

## 2015-11-15 NOTE — Telephone Encounter (Signed)
Patient has an IUD insertion appointment tomorrow and has a question for a nurse.

## 2015-11-15 NOTE — Telephone Encounter (Signed)
Left message to call Khiree Bukhari at [phone]. 

## 2015-11-15 NOTE — Telephone Encounter (Signed)
Spoke with patient. Patient is scheduled to have her IUD removed and replaced tomorrow. Over the last week the patient has been experiencing a clear vaginal discharge with itching. Patient started using Monistat 3 2 days ago. Reports she is still having the same symptoms. "The monistat I am using is expired and I just noticed that last night."  Patient is asking if she may keep her appointment for her IUD removal and reinsertion for tomorrow or if she needs to reschedule due to her symptoms. Advised I will speak with Dr.Silva and return call with further recommendations. She is agreeable.

## 2015-11-15 NOTE — Telephone Encounter (Signed)
Left message to call Jayan Raymundo at [phone]. 

## 2015-11-15 NOTE — Telephone Encounter (Signed)
Spoke with patient. Advised of message as seen below from [place]. She is agreeable and verbalizes understanding.  Routing to provider for final review. Patient agreeable to disposition. Will close encounter.

## 2015-11-16 ENCOUNTER — Ambulatory Visit (INDEPENDENT_AMBULATORY_CARE_PROVIDER_SITE_OTHER): Payer: Commercial Managed Care - PPO | Admitting: Obstetrics and Gynecology

## 2015-11-16 ENCOUNTER — Encounter: Payer: Self-pay | Admitting: Obstetrics and Gynecology

## 2015-11-16 VITALS — BP 118/64 | HR 88 | Ht 63.75 in | Wt 209.0 lb

## 2015-11-16 DIAGNOSIS — N760 Acute vaginitis: Secondary | ICD-10-CM

## 2015-11-16 DIAGNOSIS — Z30433 Encounter for removal and reinsertion of intrauterine contraceptive device: Secondary | ICD-10-CM | POA: Diagnosis not present

## 2015-11-16 DIAGNOSIS — N938 Other specified abnormal uterine and vaginal bleeding: Secondary | ICD-10-CM

## 2015-11-16 LAB — POCT URINE PREGNANCY: Preg Test, Ur: NEGATIVE

## 2015-11-16 NOTE — Progress Notes (Signed)
Patient ID: Emily Chavez, female   DOB: 24-Oct-1990, 25 y.o.   MRN: [id number] GYNECOLOGY  VISIT   HPI: 25 y.o.   Single  Caucasian  female   G0P0000 with No LMP recorded.   here for [facility] IUD removal and insertion if [place] IUD. She has had irregular bleeding for 3-4 months. Her female friend is with her today for the entire visit.  Patient is very nervous.  Her last IUD insertion was painful.  Patient complaining of vaginal discharge with itching and feels she may have a yeast infection. Used Monistat, and it was expired.   Used last 2 night ago.   Used oral Cytotec last night and this am.   Is sexually active and needs contraception.   GC/CT negative each in May 2017.  UPT: Negative  GYNECOLOGIC HISTORY: No LMP recorded. Contraception:  Isla Pence IUD inserted 03-30-13 Menopausal hormone therapy:  n/a Last mammogram:  n/a Last pap smear:   07-28-14 Neg        OB History    Gravida Para Term Preterm AB TAB SAB Ectopic Multiple Living   0 0 0 0 0 0 0 0 0 0          Patient Active Problem List   Diagnosis Date Noted  . Bipolar 1 disorder [facility])     Past Medical History  Diagnosis Date  . Depression   . Bipolar 1 disorder ([place])   . Concussion 09/2012  . Asthma     exercise induced  . Migraines     with aura  . IBS (irritable bowel syndrome)     Past Surgical History  Procedure Laterality Date  . Appendectomy  age 61  . Tympanostomy tube placement    . Skyla insertion  03-30-13  . Wisdom tooth extraction      Current Outpatient Prescriptions  Medication Sig Dispense Refill  . divalproex (DEPAKOTE) 500 MG DR tablet Take by mouth. Take 500mg  one day and 1,000 the next day, rotating    . fexofenadine (ALLEGRA) 180 MG tablet Take 180 mg by mouth daily.    . Levonorgestrel (SKYLA) 13.5 MG IUD 1 each by Intrauterine route continuous.     . misoprostol (CYTOTEC) 200 MCG tablet Insert one tablet vaginally on evening before and morning of procedure. 2 tablet 0   No  current facility-administered medications for this visit.     ALLERGIES: Iodine; Iodinated diagnostic agents; Cortizone-10; and Lamictal  Family History  Problem Relation Age of Onset  . Pancreatic cancer Maternal Grandmother   . Lung cancer Maternal Grandmother   . Liver cancer Maternal Grandmother   . Hypertension Maternal Grandmother   . Mitral valve prolapse Maternal Grandmother   . Thyroid disease Mother   . Heart disease Paternal Grandmother   . Hypertension Paternal Grandfather   . Parkinson's disease Paternal Grandfather   . Alzheimer's disease Paternal Grandfather     Social History   Social History  . Marital Status: Single    Spouse Name: N/A  . Number of Children: N/A  . Years of Education: N/A   Occupational History  . Not on file.   Social History Main Topics  . Smoking status: Current Some Day Smoker    Types: Cigarettes  . Smokeless tobacco: Never Used     Comment: social smoker  . Alcohol Use: 0.0 oz/week    0 Standard drinks or equivalent per week     Comment: occ. drink  . Drug Use: No  . Sexual Activity:  No     Comment: skyla inserted 03-30-13   Other Topics Concern  . Not on file   Social History Narrative    ROS:  Pertinent items are noted in HPI.  PHYSICAL EXAMINATION:    BP 118/64 mmHg  Pulse 88  Ht 5' 3.75" (1.619 m)  Wt 209 lb (94.802 kg)  BMI 36.17 kg/m2  LMP     General appearance: alert, cooperative and appears stated age.  Anxious and tearful at times during the discussion and placement of the IUD.  (States she is nervous.)   Pelvic: External genitalia:  no lesions              Urethra:  normal appearing urethra with no masses, tenderness or lesions              Bartholins and Skenes: normal                 Vagina: normal appearing vagina with normal color and discharge, no lesions.  Affirm taken.               Cervix: no lesions.  IUD strings seen.               Bimanual Exam:  Uterus:  normal size, contour, position,  consistency, mobility, non-tender              Adnexa: normal adnexa and no mass, fullness, tenderness                 Skyla IUD removal and insertion of Kyleena IUD. Consent for procedure.  Kyleena - lot number [id number], exp March 2018. Speculum placed.  Paracervical block 10 cc 1% lidocaine lot [id number], exp 07/17/16 IUD removal with ring forceps, intact and discarded. Tenaculum to anterior cervical lip.  Uterus sounded to almost 8 cm.  Kyleena placed without difficulty.  Strings trimmed.  No complications.  Bimanual exam repeated and no change. Tolerated well.  No complications.  IUD card given to patient.   Chaperone was present for exam.  ASSESSMENT  Skyla IUD removal and placement of [place] IUD. Irregular bleeding with Skyla.  Vaginitis.    PLAN  Affirm taken.   No tx until results are back.  Discussion of [place] IUD.  Follow up for a recheck in 5 weeks.   An After Visit Summary was printed and given to the patient.  __15____ minutes face to face time of which over 50% was spent in counseling.

## 2015-11-16 NOTE — Patient Instructions (Signed)

## 2015-11-17 LAB — WET PREP BY MOLECULAR PROBE
Candida species: NEGATIVE
Gardnerella vaginalis: NEGATIVE
Trichomonas vaginosis: NEGATIVE

## 2016-01-23 ENCOUNTER — Other Ambulatory Visit: Payer: Self-pay | Admitting: Certified Nurse Midwife

## 2016-01-23 ENCOUNTER — Encounter: Payer: Self-pay | Admitting: Obstetrics and Gynecology

## 2016-01-23 ENCOUNTER — Ambulatory Visit (INDEPENDENT_AMBULATORY_CARE_PROVIDER_SITE_OTHER): Payer: Commercial Managed Care - PPO | Admitting: Obstetrics and Gynecology

## 2016-01-23 VITALS — BP 110/60 | HR 80 | Resp 14 | Wt 209.0 lb

## 2016-01-23 DIAGNOSIS — N6320 Unspecified lump in the left breast, unspecified quadrant: Secondary | ICD-10-CM

## 2016-01-23 DIAGNOSIS — N63 Unspecified lump in unspecified breast: Secondary | ICD-10-CM

## 2016-01-23 DIAGNOSIS — N644 Mastodynia: Secondary | ICD-10-CM | POA: Diagnosis not present

## 2016-01-23 NOTE — Patient Instructions (Addendum)
   Fibrocystic Breast Changes Fibrocystic breast changes occur when breast ducts become blocked, causing painful, fluid-filled lumps (cysts) to form in the breast. This is a common condition that is noncancerous (benign). It occurs when women go through hormonal changes during their menstrual cycle. Fibrocystic breast changes can affect one or both breasts. CAUSES  The exact cause of fibrocystic breast changes is not known, but it may be related to the female hormones estrogen and progesterone. Family traits that get passed from parent to child (genetics) may also be a factor in some cases. SIGNS AND SYMPTOMS   Tenderness, mild discomfort, or pain.   Swelling.   Rope-like feeling when touching the breast.   Lumpy breast, one or both sides.   Changes in breast size, especially before (larger) and after (smaller) the menstrual period.   Green or dark brown nipple discharge (not blood).  Symptoms are usually worse before menstrual periods start and get better toward the end of the menstrual period.  DIAGNOSIS  To make a diagnosis, your health care provider will ask you questions and perform a physical exam of your breasts. The health care provider may recommend other tests that can examine inside your breasts, such as:  A breast X-ray (mammogram).   Ultrasonography.  An MRI.  If something more than fibrocystic breast changes is suspected, your health care provider may take a breast tissue sample (breast biopsy) to examine. TREATMENT  Often, treatment is not needed. Your health care provider may recommend over-the-counter pain relievers to help lessen pain or discomfort caused by the fibrocystic breast changes. You may also be asked to change your diet to limit or stop eating foods or drinking beverages that contain caffeine. Foods and beverages that contain caffeine include chocolate, soda, coffee, and tea. Reducing sugar and fat in your diet may also help. Your health care  provider may also recommend:  Fine needle aspiration to remove fluid from a cyst that is causing pain.   Surgery to remove a large, persistent, and tender cyst. HOME CARE INSTRUCTIONS   Examine your breasts after every menstrual period. If you do not have menstrual periods, check your breasts the first day of every month. Feel for changes, such as more tenderness, a new growth, a change in breast size, or a change in a lump that has always been there.   Only take over-the-counter or prescription medicine as directed by your health care provider.   Wear a well-fitted support or sports bra, especially when exercising.   Decrease or avoid caffeine, fat, and sugar in your diet as directed by your health care provider.  SEEK MEDICAL CARE IF:   You have fluid leaking (discharge) from your nipples, especially bloody discharge.   You have new lumps or bumps in the breast.   Your breast or breasts become enlarged, red, and painful.   You have areas of your breast that pucker in.   Your nipples appear flat or indented.    This information is not intended to replace advice given to you by your health care provider. Make sure you discuss any questions you have with your health care provider.   Document Released: 02/19/2006 Document Revised: 01/24/2015 Document Reviewed: 10/24/2012 Elsevier Interactive Patient Education [employer].

## 2016-01-23 NOTE — Progress Notes (Signed)
GYNECOLOGY  VISIT   HPI: 25 y.o.   Single  Caucasian  female   G0P0000 with Patient's last menstrual period was 01/23/2016.   here for a breast check. She noticed a lump on the left areola 2 days ago. The whole breast is tender, feels a burning sensation on her lateral left breast for the last few days. With her new IUD she has been spotting a lot, not sure where she is in her cycle.  About about 18 month ago she was noted to have a lump in her left breast at 5 o'clock, c/w a fibroadenoma on imaging. She already has imaging set up (was due last year).  GYNECOLOGIC HISTORY: Patient's last menstrual period was 01/23/2016. Contraception:IUD, Kyleena, placed on 11/16/15 Menopausal hormone therapy: None        OB History    Gravida Para Term Preterm AB Living   0 0 0 0 0 0   SAB TAB Ectopic Multiple Live Births   0 0 0 0           Patient Active Problem List   Diagnosis Date Noted  . Bipolar 1 disorder ([place])     Past Medical History:  Diagnosis Date  . Asthma    exercise induced  . Bipolar 1 disorder ([place])   . Concussion 09/2012  . Depression   . IBS (irritable bowel syndrome)   . Migraines    with aura    Past Surgical History:  Procedure Laterality Date  . APPENDECTOMY  age 4  . skyla insertion  03-30-13  . TYMPANOSTOMY TUBE PLACEMENT    . WISDOM TOOTH EXTRACTION      Current Outpatient Prescriptions  Medication Sig Dispense Refill  . divalproex (DEPAKOTE) 500 MG DR tablet Take by mouth. Take 500mg  one day and 1,000 the next day, rotating    . fexofenadine (ALLEGRA) 180 MG tablet Take 180 mg by mouth daily.    . Levonorgestrel (SKYLA) 13.5 MG IUD 1 each by Intrauterine route continuous.      No current facility-administered medications for this visit.      ALLERGIES: Iodine; Iodinated diagnostic agents; Cortizone-10 [hydrocortisone]; and Lamictal [lamotrigine]  Family History  Problem Relation Age of Onset  . Pancreatic cancer Maternal Grandmother   . Lung  cancer Maternal Grandmother   . Liver cancer Maternal Grandmother   . Hypertension Maternal Grandmother   . Mitral valve prolapse Maternal Grandmother   . Thyroid disease Mother   . Heart disease Paternal Grandmother   . Hypertension Paternal Grandfather   . Parkinson's disease Paternal Grandfather   . Alzheimer's disease Paternal Grandfather     Social History   Social History  . Marital status: Single    Spouse name: N/A  . Number of children: N/A  . Years of education: N/A   Occupational History  . Not on file.   Social History Main Topics  . Smoking status: Current Some Day Smoker    Types: Cigarettes  . Smokeless tobacco: Never Used     Comment: social smoker  . Alcohol use 0.0 oz/week     Comment: occ. drink  . Drug use: No  . Sexual activity: No     Comment: skyla inserted 03-30-13   Other Topics Concern  . Not on file   Social History Narrative  . No narrative on file    Review of Systems  Constitutional: Negative.   HENT: Negative.   Eyes: Negative.   Respiratory: Negative.   Cardiovascular: Negative.  Gastrointestinal: Negative.   Genitourinary:       Lump left areola  Musculoskeletal: Negative.   Skin: Negative.   Neurological: Negative.   Endo/Heme/Allergies: Negative.   Psychiatric/Behavioral: Negative.     PHYSICAL EXAMINATION:    BP 110/60 (BP Location: Right Arm, Patient Position: Sitting, Cuff Size: Normal)   Pulse 80   Resp 14   Wt 209 lb (94.8 kg)   LMP 01/23/2016   BMI 36.16 kg/m     General appearance: alert, cooperative and appears stated age Breasts: in her left breast at 11 o'clock on the edge of the areola is a small pimple, no surrounding erythema. In the left breast at 5 o'clock there is a 1 cm, smooth, mobile, tender lump (c/w prior fibroadenoma). She is tender with palpation in the left lateral breast. No erythema. No right breast lumps.  Lymph: no supraclavicular or axillary adenopathy  ASSESSMENT Pimple on the left  breast, tender, no surrounding erythema Stable left breast lump, likely fibroadenoma Mastalgia, left breast. No signs of infection   PLAN Use warm compresses to the pimple as needed She scheduled a follow up ultrasound next week for her left breast Stop caffeine  Watch for erythema in her breast, return if her pain doesn't resolve Use ibuprofen for pain   An After Visit Summary was printed and given to the patient.

## 2016-02-01 ENCOUNTER — Ambulatory Visit
Admission: RE | Admit: 2016-02-01 | Discharge: 2016-02-01 | Disposition: A | Discharge status: Home / Self Care | Payer: Commercial Managed Care - PPO | Source: Ambulatory Visit | Attending: Certified Nurse Midwife | Admitting: Certified Nurse Midwife

## 2016-02-01 DIAGNOSIS — N6320 Unspecified lump in the left breast, unspecified quadrant: Secondary | ICD-10-CM

## 2016-08-29 ENCOUNTER — Ambulatory Visit: Payer: Commercial Managed Care - PPO | Admitting: Certified Nurse Midwife

## 2016-09-05 ENCOUNTER — Ambulatory Visit: Payer: Commercial Managed Care - PPO | Admitting: Certified Nurse Midwife

## 2016-10-07 ENCOUNTER — Telehealth: Payer: Self-pay | Admitting: Certified Nurse Midwife

## 2016-10-07 NOTE — Telephone Encounter (Signed)
Patient is having heavy clotting and diarrhea with an iud.

## 2016-10-07 NOTE — Telephone Encounter (Signed)
Spoke with patient. Patient states she typically has "stomach problems" while on cycles and history of IBS. Patient reports diarrhea that started 5/22, 5-8 episodes. Patient reports cycle started fully today, unable to wear tampon d/t cramping, reports 8/10. Reports several small clots, penny sized. Skyla IUD in place. Patient states she took 800 mg motrin 1.5 hours ago for cramping. Patient states she left work today d/t diarrhea. Current pad in place for an hour, reports not saturated. Denies fever, chills, reports nausea. Recommended patient f/u with pcp for evaluation of diarrhea. Advised patient would review with Melvia Heaps, CNM And return call with any additional recommendations, patient is agreeable.   Melvia Heaps, CNM- any additional recommendations?

## 2016-10-07 NOTE — Telephone Encounter (Signed)
Spoke with patient. Patient denies pelvic pain, reports cramping has subsided. Recommended f/u with PCP for further evaluation. Patient verbalizes understanding and is agreeable.  Routing to provider for final review. Patient is agreeable to disposition. Will close encounter.

## 2016-10-07 NOTE — Telephone Encounter (Signed)
If does not seem to be IUD or pelvic origin pain, agree with PCP for diarrhea.

## 2017-01-16 ENCOUNTER — Ambulatory Visit: Payer: Commercial Managed Care - PPO | Admitting: Certified Nurse Midwife

## 2017-01-23 ENCOUNTER — Encounter: Payer: Self-pay | Admitting: Certified Nurse Midwife

## 2017-01-23 ENCOUNTER — Other Ambulatory Visit (HOSPITAL_COMMUNITY)
Admission: RE | Admit: 2017-01-23 | Discharge: 2017-01-23 | Disposition: A | Discharge status: Home / Self Care | Payer: Commercial Managed Care - PPO | Source: Ambulatory Visit | Attending: Certified Nurse Midwife | Admitting: Certified Nurse Midwife

## 2017-01-23 ENCOUNTER — Ambulatory Visit (INDEPENDENT_AMBULATORY_CARE_PROVIDER_SITE_OTHER): Payer: Commercial Managed Care - PPO | Admitting: Certified Nurse Midwife

## 2017-01-23 VITALS — BP 110/60 | HR 96 | Resp 14 | Ht 63.25 in | Wt 203.0 lb

## 2017-01-23 DIAGNOSIS — F419 Anxiety disorder, unspecified: Secondary | ICD-10-CM | POA: Insufficient documentation

## 2017-01-23 DIAGNOSIS — Z30431 Encounter for routine checking of intrauterine contraceptive device: Secondary | ICD-10-CM | POA: Diagnosis not present

## 2017-01-23 DIAGNOSIS — Z01419 Encounter for gynecological examination (general) (routine) without abnormal findings: Secondary | ICD-10-CM

## 2017-01-23 DIAGNOSIS — N921 Excessive and frequent menstruation with irregular cycle: Secondary | ICD-10-CM | POA: Diagnosis not present

## 2017-01-23 DIAGNOSIS — Z0000 Encounter for general adult medical examination without abnormal findings: Secondary | ICD-10-CM | POA: Diagnosis not present

## 2017-01-23 DIAGNOSIS — Z124 Encounter for screening for malignant neoplasm of cervix: Secondary | ICD-10-CM | POA: Diagnosis not present

## 2017-01-23 DIAGNOSIS — G43909 Migraine, unspecified, not intractable, without status migrainosus: Secondary | ICD-10-CM | POA: Insufficient documentation

## 2017-01-23 LAB — POCT URINALYSIS DIPSTICK
Bilirubin, UA: NEGATIVE
Glucose, UA: NEGATIVE
Leukocytes, UA: NEGATIVE
Nitrite, UA: NEGATIVE
Protein, UA: NEGATIVE
Urobilinogen, UA: 0.2 E.U./dL
pH, UA: 5 (ref 5.0–8.0)

## 2017-01-23 LAB — POCT URINE PREGNANCY: Preg Test, Ur: NEGATIVE

## 2017-01-23 NOTE — Patient Instructions (Signed)
General topics  Next pap or exam is  due in 1 year Take a Women's multivitamin Take 1200 mg. of calcium daily - prefer dietary If any concerns in interim to call back  Breast Self-Awareness Practicing breast self-awareness may pick up problems early, prevent significant medical complications, and possibly save your life. By practicing breast self-awareness, you can become familiar with how your breasts look and feel and if your breasts are changing. This allows you to notice changes early. It can also offer you some reassurance that your breast health is good. One way to learn what is normal for your breasts and whether your breasts are changing is to do a breast self-exam. If you find a lump or something that was not present in the past, it is best to contact your caregiver right away. Other findings that should be evaluated by your caregiver include nipple discharge, especially if it is bloody; skin changes or reddening; areas where the skin seems to be pulled in (retracted); or new lumps and bumps. Breast pain is seldom associated with cancer (malignancy), but should also be evaluated by a caregiver. BREAST SELF-EXAM The best time to examine your breasts is 5 7 days after your menstrual period is over.  ExitCare Patient Information 2013 [employer].   Exercise to Stay Healthy Exercise helps you become and stay healthy. EXERCISE IDEAS AND TIPS Choose exercises that:  You enjoy.  Fit into your day. You do not need to exercise really hard to be healthy. You can do exercises at a slow or medium level and stay healthy. You can:  Stretch before and after working out.  Try yoga, Pilates, or tai chi.  Lift weights.  Walk fast, swim, jog, run, climb stairs, bicycle, dance, or rollerskate.  Take aerobic classes. Exercises that burn about 150 calories:  Running 1  miles in 15 minutes.  Playing volleyball for 45 to 60 minutes.  Washing and waxing a car for 45 to 60  minutes.  Playing touch football for 45 minutes.  Walking 1  miles in 35 minutes.  Pushing a stroller 1  miles in 30 minutes.  Playing basketball for 30 minutes.  Raking leaves for 30 minutes.  Bicycling 5 miles in 30 minutes.  Walking 2 miles in 30 minutes.  Dancing for 30 minutes.  Shoveling snow for 15 minutes.  Swimming laps for 20 minutes.  Walking up stairs for 15 minutes.  Bicycling 4 miles in 15 minutes.  Gardening for 30 to 45 minutes.  Jumping rope for 15 minutes.  Washing windows or floors for 45 to 60 minutes. Document Released: 06/07/2010 Document Revised: 07/28/2011 Document Reviewed: 06/07/2010 ExitCare Patient Information 2013 [employer].   Other topics ( that may be useful information):    Sexually Transmitted Disease Sexually transmitted disease (STD) refers to any infection that is passed from person to person during sexual activity. This may happen by way of saliva, semen, blood, vaginal mucus, or urine. Common STDs include:  Gonorrhea.  Chlamydia.  Syphilis.  HIV/AIDS.  Genital herpes.  Hepatitis B and C.  Trichomonas.  Human papillomavirus (HPV).  Pubic lice. CAUSES  An STD may be spread by bacteria, virus, or parasite. A person can get an STD by:  Sexual intercourse with an infected person.  Sharing sex toys with an infected person.  Sharing needles with an infected person.  Having intimate contact with the genitals, mouth, or rectal areas of an infected person. SYMPTOMS  Some people may not have any symptoms, but   they can still pass the infection to others. Different STDs have different symptoms. Symptoms include:  Painful or bloody urination.  Pain in the pelvis, abdomen, vagina, anus, throat, or eyes.  Skin rash, itching, irritation, growths, or sores (lesions). These usually occur in the genital or anal area.  Abnormal vaginal discharge.  Penile discharge in men.  Soft, flesh-colored skin growths in the  genital or anal area.  Fever.  Pain or bleeding during sexual intercourse.  Swollen glands in the groin area.  Yellow skin and eyes (jaundice). This is seen with hepatitis. DIAGNOSIS  To make a diagnosis, your caregiver may:  Take a medical history.  Perform a physical exam.  Take a specimen (culture) to be examined.  Examine a sample of discharge under a microscope.  Perform blood test TREATMENT   Chlamydia, gonorrhea, trichomonas, and syphilis can be cured with antibiotic medicine.  Genital herpes, hepatitis, and HIV can be treated, but not cured, with prescribed medicines. The medicines will lessen the symptoms.  Genital warts from HPV can be treated with medicine or by freezing, burning (electrocautery), or surgery. Warts may come back.  HPV is a virus and cannot be cured with medicine or surgery.However, abnormal areas may be followed very closely by your caregiver and may be removed from the cervix, vagina, or vulva through office procedures or surgery. If your diagnosis is confirmed, your recent sexual partners need treatment. This is true even if they are symptom-free or have a negative culture or evaluation. They should not have sex until their caregiver says it is okay. HOME CARE INSTRUCTIONS  All sexual partners should be informed, tested, and treated for all STDs.  Take your antibiotics as directed. Finish them even if you start to feel better.  Only take over-the-counter or prescription medicines for pain, discomfort, or fever as directed by your caregiver.  Rest.  Eat a balanced diet and drink enough fluids to keep your urine clear or pale yellow.  Do not have sex until treatment is completed and you have followed up with your caregiver. STDs should be checked after treatment.  Keep all follow-up appointments, Pap tests, and blood tests as directed by your caregiver.  Only use latex condoms and water-soluble lubricants during sexual activity. Do not use  petroleum jelly or oils.  Avoid alcohol and illegal drugs.  Get vaccinated for HPV and hepatitis. If you have not received these vaccines in the past, talk to your caregiver about whether one or both might be right for you.  Avoid risky sex practices that can break the skin. The only way to avoid getting an STD is to avoid all sexual activity.Latex condoms and dental dams (for oral sex) will help lessen the risk of getting an STD, but will not completely eliminate the risk. SEEK MEDICAL CARE IF:   You have a fever.  You have any new or worsening symptoms. Document Released: 07/26/2002 Document Revised: 07/28/2011 Document Reviewed: 08/02/2010 [facility] Patient Information 2013 [place].    Domestic Abuse You are being battered or abused if someone close to you hits, pushes, or physically hurts you in any way. You also are being abused if you are forced into activities. You are being sexually abused if you are forced to have sexual contact of any kind. You are being emotionally abused if you are made to feel worthless or if you are constantly threatened. It is important to remember that help is available. No one has the right to abuse you. PREVENTION OF FURTHER  ABUSE  Learn the warning signs of danger. This varies with situations but may include: the use of alcohol, threats, isolation from friends and family, or forced sexual contact. Leave if you feel that violence is going to occur.  If you are attacked or beaten, report it to the police so the abuse is documented. You do not have to press charges. The police can protect you while you or the attackers are leaving. Get the officer's name and badge number and a copy of the report.  Find someone you can trust and tell them what is happening to you: your caregiver, a nurse, clergy member, close friend or family member. Feeling ashamed is natural, but remember that you have done nothing wrong. No one deserves abuse. Document Released:  05/02/2000 Document Revised: 07/28/2011 Document Reviewed: 07/11/2010 ExitCare Patient Information 2013 [employer].    How Much is Too Much Alcohol? Drinking too much alcohol can cause injury, accidents, and health problems. These types of problems can include:   Car crashes.  Falls.  Family fighting (domestic violence).  Drowning.  Fights.  Injuries.  Burns.  Damage to certain organs.  Having a baby with birth defects. ONE DRINK CAN BE TOO MUCH WHEN YOU ARE:  Working.  Pregnant or breastfeeding.  Taking medicines. Ask your doctor.  Driving or planning to drive. If you or someone you know has a drinking problem, get help from a doctor.  Document Released: 03/01/2009 Document Revised: 07/28/2011 Document Reviewed: 03/01/2009 ExitCare Patient Information 2013 [employer].   Smoking Hazards Smoking cigarettes is extremely bad for your health. Tobacco smoke has over 200 known poisons in it. There are over 60 chemicals in tobacco smoke that cause cancer. Some of the chemicals found in cigarette smoke include:   Cyanide.  Benzene.  Formaldehyde.  Methanol (wood alcohol).  Acetylene (fuel used in welding torches).  Ammonia. Cigarette smoke also contains the poisonous gases nitrogen oxide and carbon monoxide.  Cigarette smokers have an increased risk of many serious medical problems and Smoking causes approximately:  90% of all lung cancer deaths in men.  80% of all lung cancer deaths in women.  90% of deaths from chronic obstructive lung disease. Compared with nonsmokers, smoking increases the risk of:  Coronary heart disease by 2 to 4 times.  Stroke by 2 to 4 times.  Men developing lung cancer by 23 times.  Women developing lung cancer by 13 times.  Dying from chronic obstructive lung diseases by 12 times.  . Smoking is the most preventable cause of death and disease in our society.  WHY IS SMOKING ADDICTIVE?  Nicotine is the chemical  agent in tobacco that is capable of causing addiction or dependence.  When you smoke and inhale, nicotine is absorbed rapidly into the bloodstream through your lungs. Nicotine absorbed through the lungs is capable of creating a powerful addiction. Both inhaled and non-inhaled nicotine may be addictive.  Addiction studies of cigarettes and spit tobacco show that addiction to nicotine occurs mainly during the teen years, when young people begin using tobacco products. WHAT ARE THE BENEFITS OF QUITTING?  There are many health benefits to quitting smoking.   Likelihood of developing cancer and heart disease decreases. Health improvements are seen almost immediately.  Blood pressure, pulse rate, and breathing patterns start returning to normal soon after quitting. QUITTING SMOKING   American Lung Association - 1-800-LUNGUSA  American Cancer Society - 1-800-ACS-2345 Document Released: 06/12/2004 Document Revised: 07/28/2011 Document Reviewed: 02/14/2009 ExitCare Patient Information 2013 ExitCare[employer]   LLC.   Stress Management Stress is a state of physical or mental tension that often results from changes in your life or normal routine. Some common causes of stress are:  Death of a loved one.  Injuries or severe illnesses.  Getting fired or changing jobs.  Moving into a new home. Other causes may be:  Sexual problems.  Business or financial losses.  Taking on a large debt.  Regular conflict with someone at home or at work.  Constant tiredness from lack of sleep. It is not just bad things that are stressful. It may be stressful to:  Win the lottery.  Get married.  Buy a new car. The amount of stress that can be easily tolerated varies from person to person. Changes generally cause stress, regardless of the types of change. Too much stress can affect your health. It may lead to physical or emotional problems. Too little stress (boredom) may also become stressful. SUGGESTIONS TO  REDUCE STRESS:  Talk things over with your family and friends. It often is helpful to share your concerns and worries. If you feel your problem is serious, you may want to get help from a professional counselor.  Consider your problems one at a time instead of lumping them all together. Trying to take care of everything at once may seem impossible. List all the things you need to do and then start with the most important one. Set a goal to accomplish 2 or 3 things each day. If you expect to do too many in a single day you will naturally fail, causing you to feel even more stressed.  Do not use alcohol or drugs to relieve stress. Although you may feel better for a short time, they do not remove the problems that caused the stress. They can also be habit forming.  Exercise regularly - at least 3 times per week. Physical exercise can help to relieve that "uptight" feeling and will relax you.  The shortest distance between despair and hope is often a good night's sleep.  Go to bed and get up on time allowing yourself time for appointments without being rushed.  Take a short "time-out" period from any stressful situation that occurs during the day. Close your eyes and take some deep breaths. Starting with the muscles in your face, tense them, hold it for a few seconds, then relax. Repeat this with the muscles in your neck, shoulders, hand, stomach, back and legs.  Take good care of yourself. Eat a balanced diet and get plenty of rest.  Schedule time for having fun. Take a break from your daily routine to relax. HOME CARE INSTRUCTIONS   Call if you feel overwhelmed by your problems and feel you can no longer manage them on your own.  Return immediately if you feel like hurting yourself or someone else. Document Released: 10/29/2000 Document Revised: 07/28/2011 Document Reviewed: 06/21/2007 ExitCare Patient Information 2013 ExitCare[employer].   

## 2017-01-23 NOTE — Progress Notes (Signed)
26 y.o. G0P0000 Single  Caucasian Fe here for annual exam.  Kyleena IUD working well. Partner same. No STD screening, except HPV if possible. Having regular period and some spotting,"is this normal with IUD?Marland Kitchen No vaginal discharge concerns. 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 Has skin tag she plans to have removed on inner thigh. No other  Health issues today.   Patient's last menstrual period was 01/21/2017.          Sexually active: Yes.    The current method of family planning is IUD. Placed 11/16/15 Exercising: No.  The patient does not participate in regular exercise at present. Smoker:  no  Health Maintenance: Pap:  07/28/14 neg  History of Abnormal Pap: no MMG: 02/01/16 [place] left BIRADS2:benign. F/u age 49 Self Breast exams: no Colonoscopy:  05/25/15 Normal. F/u age 38 BMD:   Never TDaP:  2011 Gardasil: 2008 Hep C and HIV: Done  Labs: Not today  UA: Ketones=Mod, RBC=Mod UPT=Neg    reports that she quit smoking about 2 months ago. Her smoking use included Cigarettes. She has never used smokeless tobacco. She reports that she does not drink alcohol or use drugs.  Past Medical History:  Diagnosis Date  . Asthma    exercise induced  . Bipolar 1 disorder ([place])   . Concussion 09/2012  . Depression   . IBS (irritable bowel syndrome)   . Migraines    with aura    Past Surgical History:  Procedure Laterality Date  . APPENDECTOMY  age 61  . skyla insertion  03-30-13  . TYMPANOSTOMY TUBE PLACEMENT    . WISDOM TOOTH EXTRACTION      Current Outpatient Prescriptions  Medication Sig Dispense Refill  . divalproex (DEPAKOTE) 500 MG DR tablet Take by mouth. Take 500mg  one day and 1,000 the next day, rotating    . fexofenadine (ALLEGRA) 180 MG tablet Take 180 mg by mouth daily.    . Levonorgestrel 19.5 MG IUD by Intrauterine route.     No current facility-administered medications for this visit.     Family History  Problem Relation Age of Onset  . Thyroid disease Mother    . Pancreatic cancer Maternal Grandmother   . Lung cancer Maternal Grandmother   . Liver cancer Maternal Grandmother   . Hypertension Maternal Grandmother   . Mitral valve prolapse Maternal Grandmother   . Heart disease Paternal Grandmother   . Hypertension Paternal Grandfather   . Parkinson's disease Paternal Grandfather   . Alzheimer's disease Paternal Grandfather     ROS:  Pertinent items are noted in HPI.  Otherwise, a comprehensive ROS was negative.  Exam:   BP 110/60 (BP Location: Right Arm, Patient Position: Sitting, Cuff Size: Large)   Pulse 96   Resp 14   Ht 5' 3.25" (1.607 m)   Wt 203 lb (92.1 kg)   LMP 01/21/2017   BMI 35.68 kg/m  Height: 5' 3.25" (160.7 cm) Ht Readings from Last 3 Encounters:  01/23/17 5' 3.25" (1.607 m)  11/16/15 5' 3.75" (1.619 m)  11/01/15 5' 3.75" (1.619 m)    General appearance: alert, cooperative and appears stated age Head: Normocephalic, without obvious abnormality, atraumatic Neck: no adenopathy, supple, symmetrical, trachea midline and thyroid normal to inspection and palpation Lungs: clear to auscultation bilaterally Breasts: normal appearance, no masses or tenderness, No nipple retraction or dimpling, No nipple discharge or bleeding, No axillary or supraclavicular adenopathy Left breast at 5 o'clock small mobile non tender mass, history of fibroadenoma, no  size change Approximately 1 cm Heart: regular rate and rhythm Abdomen: soft, non-tender; no masses,  no organomegaly Extremities: extremities normal, atraumatic, no cyanosis or edema Skin: Skin color, texture, turgor normal. No rashes or lesions Lymph nodes: Cervical, supraclavicular, and axillary nodes normal. No abnormal inguinal nodes palpated Neurologic: Grossly normal   Pelvic: External genitalia:  no lesions, normal female, small skin tag on left inner thigh              Urethra:  normal appearing urethra with no masses, tenderness or lesions              Bartholin's and  Skene's: normal                 Vagina: normal appearing vagina with normal color and discharge, no lesions              Cervix: no bleeding following Pap, no cervical motion tenderness, no lesions and nulliparous appearance              Pap taken: Yes.   Bimanual Exam:  Uterus:  normal size, contour, position, consistency, mobility, non-tender              Adnexa: normal adnexa and no mass, fullness, tenderness               Rectovaginal: Confirms               Anus:  normal   Chaperone present: yes  A:  Well Woman with normal exam  Contraception Kyleena IUD  Left breast fibroadenoma known, no change  Ketonuria, on Atkins diet shakes  Skin tag, on  Left inner thigh, no inflammation noted  Partner history of HPV, patient would like screen with PAP smear    P:   Reviewed health and wellness pertinent to exam  Discussed normal bleeding profile with IUD and expectations. Patient feels reassured that what she is having is normal. Warning signs given.  Continue SBE and advise if change in fibroadenoma  Discussed importance of food also with Atkins diet and meaning of Ketonuria and concerns with this occurring all the time  Patient plans dermatology check and will have removed there.  Discussed HPV and etiology, and she also has taken Gardasil. Will do pap today with HPV request.  Pap smear: yes   counseled on breast self exam, STD prevention, HIV risk factors and prevention, adequate intake of calcium and vitamin D, diet and exercise  return annually or prn  An After Visit Summary was printed and given to the patient.

## 2017-01-27 LAB — CYTOLOGY - PAP
Diagnosis: NEGATIVE
HPV: NOT DETECTED

## 2017-08-19 ENCOUNTER — Telehealth: Payer: Self-pay | Admitting: Certified Nurse Midwife

## 2017-08-19 NOTE — Telephone Encounter (Signed)
Spoke with patient. Advised kyleena was placed 11/16/2015. Due for removal 11/15/2020. Patient states that she and her partner are thinking about trying for pregnancy. Advised if she decides she would like to have her IUD removed early can always call and schedule and appointment. Patient is agreeable.  Routing to provider for final review. Patient agreeable to disposition. Will close encounter.

## 2017-08-19 NOTE — Telephone Encounter (Signed)
Patient has some questions about when she needs to have her IUD removed?

## 2018-01-11 DIAGNOSIS — J3089 Other allergic rhinitis: Secondary | ICD-10-CM | POA: Insufficient documentation

## 2018-01-29 ENCOUNTER — Ambulatory Visit: Payer: Commercial Managed Care - PPO | Admitting: Certified Nurse Midwife

## 2018-02-12 ENCOUNTER — Encounter: Payer: Self-pay | Admitting: Certified Nurse Midwife

## 2018-02-12 ENCOUNTER — Ambulatory Visit: Payer: 59 | Admitting: Certified Nurse Midwife

## 2018-02-12 VITALS — BP 104/66 | HR 68 | Resp 16 | Ht 63.25 in | Wt 221.0 lb

## 2018-02-12 DIAGNOSIS — L723 Sebaceous cyst: Secondary | ICD-10-CM | POA: Diagnosis not present

## 2018-02-12 NOTE — Progress Notes (Signed)
  Subjective:     Patient ID: Emily Chavez, female   DOB: 1990-11-01, 27 y.o.   MRN: [id number]  27 yo white female here with complaint of bump on vulva area which "popped" last night with slight blood from area. Was treated for yeast 2 weeks ago with Diflucan from PCP.  Still having vaginal irritation and uncomfortable. Shaves in genital area with occasional skin bumps, no issues. Contraception IUD. Sexually active. No partner change.     Review of Systems  Constitutional: Negative.   HENT: Negative.   Endocrine:       Perspires more since weight gain in vaginal area  Genitourinary: Positive for genital sores. Negative for decreased urine volume, difficulty urinating, dyspareunia, dysuria, frequency, pelvic pain, vaginal bleeding, vaginal discharge and vaginal pain.       Small bump which opened  Skin: Negative.   Neurological: Negative.   Psychiatric/Behavioral: Negative.        Objective:   Physical Exam  Constitutional: She is oriented to person, place, and time. She appears well-developed and well-nourished.  Genitourinary: Rectum normal and vagina normal.    Pelvic exam was performed with patient supine. There is no rash, tenderness, lesion or injury on the right labia. There is no rash, tenderness, lesion or injury on the left labia. Cervix exhibits no discharge. Right adnexum displays no mass, no tenderness and no fullness. Left adnexum displays no mass, no tenderness and no fullness.  Lymphadenopathy: No inguinal adenopathy noted on the right or left side.       Right: No inguinal adenopathy present.       Left: No inguinal adenopathy present.  Neurological: She is alert and oriented to person, place, and time.  Skin: Skin is warm.       Assessment:     Sebaceous cyst of right vulva area( see diagram) Normal appearing vaginal discharge    Plan:     Discussed finding with patient and etiology of sebaceous cyst. Questions addressed. Discussed epsom salt tub bath for  the next 2-3 days, avoid squeezing area. Should resolve with no issues. Will recheck with next aex( 1 week)  Rv prn, aex

## 2018-02-12 NOTE — Patient Instructions (Signed)
Epidermal Cyst An epidermal cyst is sometimes called an epidermal inclusion cyst or an infundibular cyst. It is a sac made of skin tissue. The sac contains a substance called keratin. Keratin is a protein that is normally secreted through the hair follicles. When keratin becomes trapped in the top layer of skin (epidermis), it can form an epidermal cyst. Epidermal cysts are usually found on the face, neck, trunk, and genitals. These cysts are usually harmless (benign), and they may not cause symptoms unless they become infected. It is important not to pop epidermal cysts yourself. What are the causes? This condition may be caused by:  A blocked hair follicle.  A hair that curls and re-enters the skin instead of growing straight out of the skin (ingrown hair).  A blocked pore.  Irritated skin.  An injury to the skin.  Certain conditions that are passed along from parent to child (inherited).  Human papillomavirus (HPV).  What increases the risk? The following factors may make you more likely to develop an epidermal cyst:  Having acne.  Being overweight.  Wearing tight clothing.  What are the signs or symptoms? The only symptom of this condition may be a small, painless lump underneath the skin. When an epidermal cyst becomes infected, symptoms may include:  Redness.  Inflammation.  Tenderness.  Warmth.  Fever.  Keratin draining from the cyst. Keratin may look like a grayish-white, bad-smelling substance.  Pus draining from the cyst.  How is this diagnosed? This condition is diagnosed with a physical exam. In some cases, you may have a sample of tissue (biopsy) taken from your cyst to be examined under a microscope or tested for bacteria. You may be referred to a health care provider who specializes in skin care (dermatologist). How is this treated? In many cases, epidermal cysts go away on their own without treatment. If a cyst becomes infected, treatment may  include:  Opening and draining the cyst. After draining, minor surgery to remove the rest of the cyst may be done.  Antibiotic medicine to help prevent infection.  Injections of medicines (steroids) that help to reduce inflammation.  Surgery to remove the cyst. Surgery may be done if: ? The cyst becomes large. ? The cyst bothers you. ? There is a chance that the cyst could turn into cancer.  Follow these instructions at home:  Take over-the-counter and prescription medicines only as told by your health care provider.  If you were prescribed an antibiotic, use it as told by your health care provider. Do not stop using the antibiotic even if you start to feel better.  Keep the area around your cyst clean and dry.  Wear loose, dry clothing.  Do not try to pop your cyst.  Avoid touching your cyst.  Check your cyst every day for signs of infection.  Keep all follow-up visits as told by your health care provider. This is important. How is this prevented?  Wear clean, dry, clothing.  Avoid wearing tight clothing.  Keep your skin clean and dry. Shower or take baths every day.  Wash your body with a benzoyl peroxide wash when you shower or bathe. Contact a health care provider if:  Your cyst develops symptoms of infection.  Your condition is not improving or is getting worse.  You develop a cyst that looks different from other cysts you have had.  You have a fever. Get help right away if:  Redness spreads from the cyst into the surrounding area. This information is   not intended to replace advice given to you by your health care provider. Make sure you discuss any questions you have with your health care provider. Document Released: 04/05/2004 Document Revised: 01/02/2016 Document Reviewed: 03/07/2015 Elsevier Interactive Patient Education  2018 [employer]  

## 2018-02-19 ENCOUNTER — Ambulatory Visit: Payer: Self-pay | Admitting: Certified Nurse Midwife

## 2018-03-12 ENCOUNTER — Ambulatory Visit: Payer: Self-pay | Admitting: Certified Nurse Midwife

## 2018-03-12 NOTE — Progress Notes (Deleted)
27 y.o. G0P0000 Single  {Race/ethnicity:17218} Fe here for annual exam.    No LMP recorded. (Menstrual status: IUD).          Sexually active: {yes no:314532}  The current method of family planning is {contraception:315051}.    Exercising: {yes no:314532}  {types:19826} Smoker:  {YES NO:22349}  ROS  Health Maintenance: Pap:  07-28-14 neg, 01-23-17 neg HPV HR neg History of Abnormal Pap: {YES NO:22349} MMG:  02-01-16 u/s left breast birads 2:neg f/u age 65 Self Breast exams: {YES NO:22349} Colonoscopy:  2017 f/u age 57 BMD:   none TDaP:  2011 Shingles: no Pneumonia: no Hep C and HIV: *** Labs: ***   reports that she has been smoking cigarettes. She has never used smokeless tobacco. She reports that she does not drink alcohol or use drugs.  Past Medical History:  Diagnosis Date  . Asthma    exercise induced  . Bipolar 1 disorder ([place])   . Concussion 09/2012  . Depression   . IBS (irritable bowel syndrome)   . Migraines    with aura    Past Surgical History:  Procedure Laterality Date  . APPENDECTOMY  age 22  . skyla insertion  03-30-13  . TYMPANOSTOMY TUBE PLACEMENT    . WISDOM TOOTH EXTRACTION      Current Outpatient Medications  Medication Sig Dispense Refill  . cetirizine (ZYRTEC) 10 MG tablet Take by mouth.    . divalproex (DEPAKOTE) 500 MG DR tablet Take by mouth. Take 500mg  one day and 1,000 the next day, rotating    . Levonorgestrel 19.5 MG IUD by Intrauterine route.     No current facility-administered medications for this visit.     Family History  Problem Relation Age of Onset  . Thyroid disease Mother   . Pancreatic cancer Maternal Grandmother   . Lung cancer Maternal Grandmother   . Liver cancer Maternal Grandmother   . Hypertension Maternal Grandmother   . Mitral valve prolapse Maternal Grandmother   . Heart disease Paternal Grandmother   . Hypertension Paternal Grandfather   . Parkinson's disease Paternal Grandfather   . Alzheimer's disease  Paternal Grandfather     ROS:  Pertinent items are noted in HPI.  Otherwise, a comprehensive ROS was negative.  Exam:   There were no vitals taken for this visit.   Ht Readings from Last 3 Encounters:  02/12/18 5' 3.25" (1.607 m)  01/23/17 5' 3.25" (1.607 m)  11/16/15 5' 3.75" (1.619 m)    General appearance: alert, cooperative and appears stated age Head: Normocephalic, without obvious abnormality, atraumatic Neck: no adenopathy, supple, symmetrical, trachea midline and thyroid {EXAM; THYROID:18604} Lungs: clear to auscultation bilaterally Breasts: {Exam; breast:13139::"normal appearance, no masses or tenderness"} Heart: regular rate and rhythm Abdomen: soft, non-tender; no masses,  no organomegaly Extremities: extremities normal, atraumatic, no cyanosis or edema Skin: Skin color, texture, turgor normal. No rashes or lesions Lymph nodes: Cervical, supraclavicular, and axillary nodes normal. No abnormal inguinal nodes palpated Neurologic: Grossly normal   Pelvic: External genitalia:  no lesions              Urethra:  normal appearing urethra with no masses, tenderness or lesions              Bartholin's and Skene's: normal                 Vagina: normal appearing vagina with normal color and discharge, no lesions  Cervix: {exam; cervix:14595}              Pap taken: {yes no:314532} Bimanual Exam:  Uterus:  {exam; uterus:12215}              Adnexa: {exam; adnexa:12223}               Rectovaginal: Confirms               Anus:  normal sphincter tone, no lesions  Chaperone present: ***  A:  Well Woman with normal exam  P:   Reviewed health and wellness pertinent to exam  Pap smear: {YES NO:22349}  {plan; gyn:5269::"mammogram","pap smear","return annually or prn"}  An After Visit Summary was printed and given to the patient.

## 2018-07-16 ENCOUNTER — Ambulatory Visit: Payer: 59 | Admitting: Certified Nurse Midwife

## 2018-07-16 ENCOUNTER — Encounter: Payer: Self-pay | Admitting: Certified Nurse Midwife

## 2018-07-16 ENCOUNTER — Other Ambulatory Visit (HOSPITAL_COMMUNITY)
Admission: RE | Admit: 2018-07-16 | Discharge: 2018-07-16 | Disposition: A | Discharge status: Home / Self Care | Payer: 59 | Source: Ambulatory Visit | Attending: Certified Nurse Midwife | Admitting: Certified Nurse Midwife

## 2018-07-16 VITALS — BP 120/62 | HR 78 | Resp 14 | Ht 63.75 in | Wt 226.0 lb

## 2018-07-16 DIAGNOSIS — Z124 Encounter for screening for malignant neoplasm of cervix: Secondary | ICD-10-CM | POA: Diagnosis not present

## 2018-07-16 DIAGNOSIS — Z01419 Encounter for gynecological examination (general) (routine) without abnormal findings: Secondary | ICD-10-CM

## 2018-07-16 DIAGNOSIS — Z30431 Encounter for routine checking of intrauterine contraceptive device: Secondary | ICD-10-CM | POA: Diagnosis not present

## 2018-07-16 DIAGNOSIS — E663 Overweight: Secondary | ICD-10-CM

## 2018-07-16 NOTE — Progress Notes (Signed)
28 y.o. G0P0000 Single  Caucasian Fe here for annual exam.  Patient states that she would like to talk about a plan for pregnancy in the next two years. Planning marriage sometime in this year. Celesta Gentile has two children now. Currently using Mirena IUD, due for removal in 2022. Patient is also on Bipolar medication and will need to discuss with MD regarding change if needed when planning for pregnancy. No other health issues today.  No LMP recorded. (Menstrual status: IUD).          Sexually active: Yes.    The current method of family planning is IUD.    Exercising: No.  The patient does not participate in regular exercise at present. Smoker:  no  Review of Systems  All other systems reviewed and are negative.   Health Maintenance: Pap:  01-23-17 neg HPV HR neg History of Abnormal Pap: no MMG:  02-01-16 left breast u/s birads 2:neg Self Breast exams: no Colonoscopy:  2017 BMD:   none TDaP:  2011 Shingles: no Pneumonia: no Hep C and HIV: yes Neg  Labs:    reports that she has been smoking cigarettes. She has never used smokeless tobacco. She reports that she does not drink alcohol or use drugs.  Past Medical History:  Diagnosis Date  . Asthma    exercise induced  . Bipolar 1 disorder ([place])   . Concussion 09/2012  . Depression   . IBS (irritable bowel syndrome)   . Migraines    with aura    Past Surgical History:  Procedure Laterality Date  . APPENDECTOMY  age 50  . skyla insertion  03-30-13  . TYMPANOSTOMY TUBE PLACEMENT    . WISDOM TOOTH EXTRACTION      Current Outpatient Medications  Medication Sig Dispense Refill  . cetirizine (ZYRTEC) 10 MG tablet Take by mouth.    . divalproex (DEPAKOTE) 500 MG DR tablet Take by mouth. Take 500mg  one day and 1,000 the next day, rotating    . Levonorgestrel 19.5 MG IUD by Intrauterine route.     No current facility-administered medications for this visit.     Family History  Problem Relation Age of Onset  . Thyroid disease  Mother   . Pancreatic cancer Maternal Grandmother   . Lung cancer Maternal Grandmother   . Liver cancer Maternal Grandmother   . Hypertension Maternal Grandmother   . Mitral valve prolapse Maternal Grandmother   . Heart disease Paternal Grandmother   . Hypertension Paternal Grandfather   . Parkinson's disease Paternal Grandfather   . Alzheimer's disease Paternal Grandfather     ROS:  Pertinent items are noted in HPI.  Otherwise, a comprehensive ROS was negative.  Exam:   There were no vitals taken for this visit.   Ht Readings from Last 3 Encounters:  02/12/18 5' 3.25" (1.607 m)  01/23/17 5' 3.25" (1.607 m)  11/16/15 5' 3.75" (1.619 m)    General appearance: alert, cooperative and appears stated age Head: Normocephalic, without obvious abnormality, atraumatic Neck: no adenopathy, supple, symmetrical, trachea midline and thyroid normal to inspection and palpation Lungs: clear to auscultation bilaterally Breasts: normal appearance, no masses or tenderness, No nipple retraction or dimpling, No nipple discharge or bleeding, No axillary or supraclavicular adenopathy Heart: regular rate and rhythm Abdomen: soft, non-tender; no masses,  no organomegaly Extremities: extremities normal, atraumatic, no cyanosis or edema Skin: Skin color, texture, turgor normal. No rashes or lesions Lymph nodes: Cervical, supraclavicular, and axillary nodes normal. No abnormal inguinal nodes palpated  Neurologic: Grossly normal   Pelvic: External genitalia:  no lesions              Urethra:  normal appearing urethra with no masses, tenderness or lesions              Bartholin's and Skene's: normal                 Vagina: normal appearing vagina with normal color and discharge, no lesions              Cervix: no cervical motion tenderness, no lesions and nulliparous appearance IUD string noted in cervical os              Pap taken: Yes.   Bimanual Exam:  Uterus:  normal size, contour, position,  consistency, mobility, non-tender and anteflexed              Adnexa: normal adnexa and no mass, fullness, tenderness               Rectovaginal: Confirms               Anus:  normal appearance, no lesions  Chaperone present: yes  A:  Well Woman with normal exam  Contraception Mirena IUD due for removal 11/15/2020  Overweight  Bipolar on stable medication with MD management  P:   Reviewed health and wellness pertinent to exam  Warning signs with IUD reviewed.  Discussed working on weight loss with weight watchers or Bariatric dietary clinic. Start on regular exercise program. Pamphlet given on both. Need to work on prior to pregnancy for better health and less issues with pregnancy.  Continue follow up with PCP as indicated.  Pap smear: yes   counseled on breast self exam, STD prevention, HIV risk factors and prevention, feminine hygiene, adequate intake of calcium and vitamin D, diet and exercise. Pamphlet given regarding preparing for a healthy pregnancy.  return annually or prn  An After Visit Summary was printed and given to the patient.

## 2018-07-16 NOTE — Patient Instructions (Signed)
General topics  Next pap or exam is  due in 1 year Take a Women's multivitamin Take 1200 mg. of calcium daily - prefer dietary If any concerns in interim to call back  Breast Self-Awareness Practicing breast self-awareness may pick up problems early, prevent significant medical complications, and possibly save your life. By practicing breast self-awareness, you can become familiar with how your breasts look and feel and if your breasts are changing. This allows you to notice changes early. It can also offer you some reassurance that your breast health is good. One way to learn what is normal for your breasts and whether your breasts are changing is to do a breast self-exam. If you find a lump or something that was not present in the past, it is best to contact your caregiver right away. Other findings that should be evaluated by your caregiver include nipple discharge, especially if it is bloody; skin changes or reddening; areas where the skin seems to be pulled in (retracted); or new lumps and bumps. Breast pain is seldom associated with cancer (malignancy), but should also be evaluated by a caregiver. BREAST SELF-EXAM The best time to examine your breasts is 5 7 days after your menstrual period is over.  ExitCare Patient Information 2013 [employer].   Exercise to Stay Healthy Exercise helps you become and stay healthy. EXERCISE IDEAS AND TIPS Choose exercises that:  You enjoy.  Fit into your day. You do not need to exercise really hard to be healthy. You can do exercises at a slow or medium level and stay healthy. You can:  Stretch before and after working out.  Try yoga, Pilates, or tai chi.  Lift weights.  Walk fast, swim, jog, run, climb stairs, bicycle, dance, or rollerskate.  Take aerobic classes. Exercises that burn about 150 calories:  Running 1  miles in 15 minutes.  Playing volleyball for 45 to 60 minutes.  Washing and waxing a car for 45 to 60  minutes.  Playing touch football for 45 minutes.  Walking 1  miles in 35 minutes.  Pushing a stroller 1  miles in 30 minutes.  Playing basketball for 30 minutes.  Raking leaves for 30 minutes.  Bicycling 5 miles in 30 minutes.  Walking 2 miles in 30 minutes.  Dancing for 30 minutes.  Shoveling snow for 15 minutes.  Swimming laps for 20 minutes.  Walking up stairs for 15 minutes.  Bicycling 4 miles in 15 minutes.  Gardening for 30 to 45 minutes.  Jumping rope for 15 minutes.  Washing windows or floors for 45 to 60 minutes. Document Released: 06/07/2010 Document Revised: 07/28/2011 Document Reviewed: 06/07/2010 ExitCare Patient Information 2013 [employer].   Other topics ( that may be useful information):    Sexually Transmitted Disease Sexually transmitted disease (STD) refers to any infection that is passed from person to person during sexual activity. This may happen by way of saliva, semen, blood, vaginal mucus, or urine. Common STDs include:  Gonorrhea.  Chlamydia.  Syphilis.  HIV/AIDS.  Genital herpes.  Hepatitis B and C.  Trichomonas.  Human papillomavirus (HPV).  Pubic lice. CAUSES  An STD may be spread by bacteria, virus, or parasite. A person can get an STD by:  Sexual intercourse with an infected person.  Sharing sex toys with an infected person.  Sharing needles with an infected person.  Having intimate contact with the genitals, mouth, or rectal areas of an infected person. SYMPTOMS  Some people may not have any symptoms, but   they can still pass the infection to others. Different STDs have different symptoms. Symptoms include:  Painful or bloody urination.  Pain in the pelvis, abdomen, vagina, anus, throat, or eyes.  Skin rash, itching, irritation, growths, or sores (lesions). These usually occur in the genital or anal area.  Abnormal vaginal discharge.  Penile discharge in men.  Soft, flesh-colored skin growths in the  genital or anal area.  Fever.  Pain or bleeding during sexual intercourse.  Swollen glands in the groin area.  Yellow skin and eyes (jaundice). This is seen with hepatitis. DIAGNOSIS  To make a diagnosis, your caregiver may:  Take a medical history.  Perform a physical exam.  Take a specimen (culture) to be examined.  Examine a sample of discharge under a microscope.  Perform blood test TREATMENT   Chlamydia, gonorrhea, trichomonas, and syphilis can be cured with antibiotic medicine.  Genital herpes, hepatitis, and HIV can be treated, but not cured, with prescribed medicines. The medicines will lessen the symptoms.  Genital warts from HPV can be treated with medicine or by freezing, burning (electrocautery), or surgery. Warts may come back.  HPV is a virus and cannot be cured with medicine or surgery.However, abnormal areas may be followed very closely by your caregiver and may be removed from the cervix, vagina, or vulva through office procedures or surgery. If your diagnosis is confirmed, your recent sexual partners need treatment. This is true even if they are symptom-free or have a negative culture or evaluation. They should not have sex until their caregiver says it is okay. HOME CARE INSTRUCTIONS  All sexual partners should be informed, tested, and treated for all STDs.  Take your antibiotics as directed. Finish them even if you start to feel better.  Only take over-the-counter or prescription medicines for pain, discomfort, or fever as directed by your caregiver.  Rest.  Eat a balanced diet and drink enough fluids to keep your urine clear or pale yellow.  Do not have sex until treatment is completed and you have followed up with your caregiver. STDs should be checked after treatment.  Keep all follow-up appointments, Pap tests, and blood tests as directed by your caregiver.  Only use latex condoms and water-soluble lubricants during sexual activity. Do not use  petroleum jelly or oils.  Avoid alcohol and illegal drugs.  Get vaccinated for HPV and hepatitis. If you have not received these vaccines in the past, talk to your caregiver about whether one or both might be right for you.  Avoid risky sex practices that can break the skin. The only way to avoid getting an STD is to avoid all sexual activity.Latex condoms and dental dams (for oral sex) will help lessen the risk of getting an STD, but will not completely eliminate the risk. SEEK MEDICAL CARE IF:   You have a fever.  You have any new or worsening symptoms. Document Released: 07/26/2002 Document Revised: 07/28/2011 Document Reviewed: 08/02/2010 [facility] Patient Information 2013 [place].    Domestic Abuse You are being battered or abused if someone close to you hits, pushes, or physically hurts you in any way. You also are being abused if you are forced into activities. You are being sexually abused if you are forced to have sexual contact of any kind. You are being emotionally abused if you are made to feel worthless or if you are constantly threatened. It is important to remember that help is available. No one has the right to abuse you. PREVENTION OF FURTHER  ABUSE  Learn the warning signs of danger. This varies with situations but may include: the use of alcohol, threats, isolation from friends and family, or forced sexual contact. Leave if you feel that violence is going to occur.  If you are attacked or beaten, report it to the police so the abuse is documented. You do not have to press charges. The police can protect you while you or the attackers are leaving. Get the officer's name and badge number and a copy of the report.  Find someone you can trust and tell them what is happening to you: your caregiver, a nurse, clergy member, close friend or family member. Feeling ashamed is natural, but remember that you have done nothing wrong. No one deserves abuse. Document Released:  05/02/2000 Document Revised: 07/28/2011 Document Reviewed: 07/11/2010 ExitCare Patient Information 2013 [employer].    How Much is Too Much Alcohol? Drinking too much alcohol can cause injury, accidents, and health problems. These types of problems can include:   Car crashes.  Falls.  Family fighting (domestic violence).  Drowning.  Fights.  Injuries.  Burns.  Damage to certain organs.  Having a baby with birth defects. ONE DRINK CAN BE TOO MUCH WHEN YOU ARE:  Working.  Pregnant or breastfeeding.  Taking medicines. Ask your doctor.  Driving or planning to drive. If you or someone you know has a drinking problem, get help from a doctor.  Document Released: 03/01/2009 Document Revised: 07/28/2011 Document Reviewed: 03/01/2009 ExitCare Patient Information 2013 [employer].   Smoking Hazards Smoking cigarettes is extremely bad for your health. Tobacco smoke has over 200 known poisons in it. There are over 60 chemicals in tobacco smoke that cause cancer. Some of the chemicals found in cigarette smoke include:   Cyanide.  Benzene.  Formaldehyde.  Methanol (wood alcohol).  Acetylene (fuel used in welding torches).  Ammonia. Cigarette smoke also contains the poisonous gases nitrogen oxide and carbon monoxide.  Cigarette smokers have an increased risk of many serious medical problems and Smoking causes approximately:  90% of all lung cancer deaths in men.  80% of all lung cancer deaths in women.  90% of deaths from chronic obstructive lung disease. Compared with nonsmokers, smoking increases the risk of:  Coronary heart disease by 2 to 4 times.  Stroke by 2 to 4 times.  Men developing lung cancer by 23 times.  Women developing lung cancer by 13 times.  Dying from chronic obstructive lung diseases by 12 times.  . Smoking is the most preventable cause of death and disease in our society.  WHY IS SMOKING ADDICTIVE?  Nicotine is the chemical  agent in tobacco that is capable of causing addiction or dependence.  When you smoke and inhale, nicotine is absorbed rapidly into the bloodstream through your lungs. Nicotine absorbed through the lungs is capable of creating a powerful addiction. Both inhaled and non-inhaled nicotine may be addictive.  Addiction studies of cigarettes and spit tobacco show that addiction to nicotine occurs mainly during the teen years, when young people begin using tobacco products. WHAT ARE THE BENEFITS OF QUITTING?  There are many health benefits to quitting smoking.   Likelihood of developing cancer and heart disease decreases. Health improvements are seen almost immediately.  Blood pressure, pulse rate, and breathing patterns start returning to normal soon after quitting. QUITTING SMOKING   American Lung Association - 1-800-LUNGUSA  American Cancer Society - 1-800-ACS-2345 Document Released: 06/12/2004 Document Revised: 07/28/2011 Document Reviewed: 02/14/2009 ExitCare Patient Information 2013 ExitCare[employer]   LLC.   Stress Management Stress is a state of physical or mental tension that often results from changes in your life or normal routine. Some common causes of stress are:  Death of a loved one.  Injuries or severe illnesses.  Getting fired or changing jobs.  Moving into a new home. Other causes may be:  Sexual problems.  Business or financial losses.  Taking on a large debt.  Regular conflict with someone at home or at work.  Constant tiredness from lack of sleep. It is not just bad things that are stressful. It may be stressful to:  Win the lottery.  Get married.  Buy a new car. The amount of stress that can be easily tolerated varies from person to person. Changes generally cause stress, regardless of the types of change. Too much stress can affect your health. It may lead to physical or emotional problems. Too little stress (boredom) may also become stressful. SUGGESTIONS TO  REDUCE STRESS:  Talk things over with your family and friends. It often is helpful to share your concerns and worries. If you feel your problem is serious, you may want to get help from a professional counselor.  Consider your problems one at a time instead of lumping them all together. Trying to take care of everything at once may seem impossible. List all the things you need to do and then start with the most important one. Set a goal to accomplish 2 or 3 things each day. If you expect to do too many in a single day you will naturally fail, causing you to feel even more stressed.  Do not use alcohol or drugs to relieve stress. Although you may feel better for a short time, they do not remove the problems that caused the stress. They can also be habit forming.  Exercise regularly - at least 3 times per week. Physical exercise can help to relieve that "uptight" feeling and will relax you.  The shortest distance between despair and hope is often a good night's sleep.  Go to bed and get up on time allowing yourself time for appointments without being rushed.  Take a short "time-out" period from any stressful situation that occurs during the day. Close your eyes and take some deep breaths. Starting with the muscles in your face, tense them, hold it for a few seconds, then relax. Repeat this with the muscles in your neck, shoulders, hand, stomach, back and legs.  Take good care of yourself. Eat a balanced diet and get plenty of rest.  Schedule time for having fun. Take a break from your daily routine to relax. HOME CARE INSTRUCTIONS   Call if you feel overwhelmed by your problems and feel you can no longer manage them on your own.  Return immediately if you feel like hurting yourself or someone else. Document Released: 10/29/2000 Document Revised: 07/28/2011 Document Reviewed: 06/21/2007 ExitCare Patient Information 2013 ExitCare[employer].   

## 2018-07-19 LAB — CYTOLOGY - PAP: Diagnosis: NEGATIVE

## 2018-09-07 ENCOUNTER — Other Ambulatory Visit: Payer: Self-pay

## 2018-09-07 ENCOUNTER — Ambulatory Visit
Admission: RE | Admit: 2018-09-07 | Discharge: 2018-09-07 | Disposition: A | Discharge status: Home / Self Care | Payer: 59 | Source: Ambulatory Visit | Attending: Physician Assistant | Admitting: Physician Assistant

## 2018-09-07 ENCOUNTER — Other Ambulatory Visit: Payer: Self-pay | Admitting: Physician Assistant

## 2018-09-07 DIAGNOSIS — S6992XA Unspecified injury of left wrist, hand and finger(s), initial encounter: Secondary | ICD-10-CM

## 2019-01-01 ENCOUNTER — Other Ambulatory Visit: Payer: Self-pay

## 2019-01-01 ENCOUNTER — Encounter (HOSPITAL_BASED_OUTPATIENT_CLINIC_OR_DEPARTMENT_OTHER): Payer: Self-pay | Admitting: Emergency Medicine

## 2019-01-01 ENCOUNTER — Emergency Department (HOSPITAL_BASED_OUTPATIENT_CLINIC_OR_DEPARTMENT_OTHER)
Admission: EM | Admit: 2019-01-01 | Discharge: 2019-01-01 | Disposition: A | Discharge status: Home / Self Care | Payer: 59 | Attending: Emergency Medicine | Admitting: Emergency Medicine

## 2019-01-01 DIAGNOSIS — R1012 Left upper quadrant pain: Secondary | ICD-10-CM | POA: Insufficient documentation

## 2019-01-01 DIAGNOSIS — F17210 Nicotine dependence, cigarettes, uncomplicated: Secondary | ICD-10-CM | POA: Insufficient documentation

## 2019-01-01 DIAGNOSIS — Z882 Allergy status to sulfonamides status: Secondary | ICD-10-CM | POA: Diagnosis not present

## 2019-01-01 DIAGNOSIS — R1010 Upper abdominal pain, unspecified: Secondary | ICD-10-CM

## 2019-01-01 DIAGNOSIS — Z79899 Other long term (current) drug therapy: Secondary | ICD-10-CM | POA: Diagnosis not present

## 2019-01-01 DIAGNOSIS — Z888 Allergy status to other drugs, medicaments and biological substances status: Secondary | ICD-10-CM | POA: Insufficient documentation

## 2019-01-01 DIAGNOSIS — J45909 Unspecified asthma, uncomplicated: Secondary | ICD-10-CM | POA: Insufficient documentation

## 2019-01-01 DIAGNOSIS — Z885 Allergy status to narcotic agent status: Secondary | ICD-10-CM | POA: Insufficient documentation

## 2019-01-01 DIAGNOSIS — Z881 Allergy status to other antibiotic agents status: Secondary | ICD-10-CM | POA: Diagnosis not present

## 2019-01-01 DIAGNOSIS — R112 Nausea with vomiting, unspecified: Secondary | ICD-10-CM | POA: Diagnosis not present

## 2019-01-01 LAB — URINALYSIS, ROUTINE W REFLEX MICROSCOPIC
Bilirubin Urine: NEGATIVE
Glucose, UA: NEGATIVE mg/dL
Ketones, ur: NEGATIVE mg/dL
Leukocytes,Ua: NEGATIVE
Nitrite: NEGATIVE
Protein, ur: NEGATIVE mg/dL
Specific Gravity, Urine: >1.03 — ABNORMAL HIGH (ref 1.005–1.030)
pH: 5.5 (ref 5.0–8.0)

## 2019-01-01 LAB — COMPREHENSIVE METABOLIC PANEL
ALT: 34 U/L (ref 0–44)
AST: 23 U/L (ref 15–41)
Albumin: 4.2 g/dL (ref 3.5–5.0)
Alkaline Phosphatase: 95 U/L (ref 38–126)
Anion gap: 10 (ref 5–15)
BUN: 9 mg/dL (ref 6–20)
CO2: 26 mmol/L (ref 22–32)
Calcium: 10.5 mg/dL — ABNORMAL HIGH (ref 8.9–10.3)
Chloride: 102 mmol/L (ref 98–111)
Creatinine, Ser: 0.59 mg/dL (ref 0.44–1.00)
GFR calc Af Amer: >60 mL/min (ref >60)
GFR calc non Af Amer: >60 mL/min (ref >60)
Glucose, Bld: 89 mg/dL (ref 70–99)
Potassium: 3.8 mmol/L (ref 3.5–5.1)
Sodium: 138 mmol/L (ref 135–145)
Total Bilirubin: 1.2 mg/dL (ref 0.3–1.2)
Total Protein: 8.4 g/dL — ABNORMAL HIGH (ref 6.5–8.1)

## 2019-01-01 LAB — URINALYSIS, MICROSCOPIC (REFLEX)

## 2019-01-01 LAB — CBC WITH DIFFERENTIAL/PLATELET
Abs Immature Granulocytes: 0.04 10*3/uL (ref 0.00–0.07)
Basophils Absolute: 0 10*3/uL (ref 0.0–0.1)
Basophils Relative: 0 %
Eosinophils Absolute: 0.2 10*3/uL (ref 0.0–0.5)
Eosinophils Relative: 2 %
HCT: 40.8 % (ref 36.0–46.0)
Hemoglobin: 13.6 g/dL (ref 12.0–15.0)
Immature Granulocytes: 0 %
Lymphocytes Relative: 32 %
Lymphs Abs: 3.7 10*3/uL (ref 0.7–4.0)
MCH: 28 pg (ref 26.0–34.0)
MCHC: 33.3 g/dL (ref 30.0–36.0)
MCV: 84 fL (ref 80.0–100.0)
Monocytes Absolute: 0.5 10*3/uL (ref 0.1–1.0)
Monocytes Relative: 4 %
Neutro Abs: 7.2 10*3/uL (ref 1.7–7.7)
Neutrophils Relative %: 62 %
Platelets: 344 10*3/uL (ref 150–400)
RBC: 4.86 MIL/uL (ref 3.87–5.11)
RDW: 12.2 % (ref 11.5–15.5)
WBC: 11.7 10*3/uL — ABNORMAL HIGH (ref 4.0–10.5)
nRBC: 0 % (ref 0.0–0.2)

## 2019-01-01 LAB — PREGNANCY, URINE: Preg Test, Ur: NEGATIVE

## 2019-01-01 LAB — LIPASE, BLOOD: Lipase: 40 U/L (ref 11–51)

## 2019-01-01 MED ORDER — OMEPRAZOLE 20 MG PO CPDR: 20.0000 mg | DELAYED_RELEASE_CAPSULE | Freq: Two times a day (BID) | ORAL | 0 refills | Status: DC

## 2019-01-01 MED ORDER — LIDOCAINE VISCOUS HCL 2 % MT SOLN
15.0000 mL | Freq: Once | OROMUCOSAL | Status: AC
  Administered 2019-01-01: 15 mL via ORAL
  Filled 2019-01-01: qty 15

## 2019-01-01 MED ORDER — ALUM & MAG HYDROXIDE-SIMETH 200-200-20 MG/5ML PO SUSP
30.0000 mL | Freq: Once | ORAL | Status: AC
  Administered 2019-01-01: 30 mL via ORAL
  Filled 2019-01-01: qty 30

## 2019-01-01 MED ORDER — ONDANSETRON HCL 4 MG/2ML IJ SOLN
4.0000 mg | Freq: Once | INTRAMUSCULAR | Status: AC
  Administered 2019-01-01: 4 mg via INTRAVENOUS
  Filled 2019-01-01: qty 2

## 2019-01-01 NOTE — ED Triage Notes (Signed)
Patient states that she is having left upper quad pain since yesterday  - the patient reports that she is nauseated and throwing up

## 2019-01-01 NOTE — ED Provider Notes (Signed)
[facility] EMERGENCY DEPARTMENT Provider Note   CSN: [id number] Arrival date & time: 01/01/19  1401     History   Chief Complaint Chief Complaint  Patient presents with  . Abdominal Pain    HPI Emily Chavez is a 28 y.o. female.     The history is provided by the patient. No language interpreter was used.  Abdominal Pain    28 year old female with history of irritable bowel syndrome, bipolar, depression presenting for evaluation of abdominal pain.  Patient reports since yesterday afternoon she has been developing gradual onset of left upper quadrant abdominal pain that radiates to her back and her left shoulder.  Pain is sharp, 7 out of 10, persistent, with associated nausea and 1 episodes of nonbloody nonbilious vomit.  She does not complain of any fever or chills, no chest pain shortness of breath or productive cough.  She denies any dysuria or hematuria diarrhea constipation.  She denies alcohol abuse or history of gallbladder etiology.  No specific treatment tried.  She did went to see urgent care center today.  She states that her urine was checked and it was normal however she was recommended to come to ER for CT scan.  Patient unable to recall last menstruation due to having an IUD.  Past Medical History:  Diagnosis Date  . Asthma    exercise induced  . Bipolar 1 disorder ([place])   . Concussion 09/2012  . Depression   . IBS (irritable bowel syndrome)   . Migraines    with aura    Patient Active Problem List   Diagnosis Date Noted  . Environmental and seasonal allergies 01/11/2018  . Anxiety 01/23/2017  . Migraine 01/23/2017  . Vitamin D deficiency 03/27/2015  . Bipolar I disorder ([place])   . Bipolar I disorder, most recent episode (or current) depressed, in partial or unspecified remission 02/04/2011    Past Surgical History:  Procedure Laterality Date  . APPENDECTOMY  age 89  . skyla insertion  03-30-13  . TYMPANOSTOMY TUBE PLACEMENT    . WISDOM  TOOTH EXTRACTION       OB History    Gravida  0   Para  0   Term  0   Preterm  0   AB  0   Living  0     SAB  0   TAB  0   Ectopic  0   Multiple  0   Live Births               Home Medications    Prior to Admission medications   Medication Sig Start Date End Date Taking? Authorizing Provider  cetirizine (ZYRTEC) 10 MG tablet Take by mouth.    [provider]  cetirizine (ZYRTEC) 10 MG tablet Take by mouth.    [provider]  divalproex (DEPAKOTE) 500 MG DR tablet Take by mouth. Take 500mg  one day and 1,000 the next day, rotating    [provider]  doxycycline (VIBRA-TABS) 100 MG tablet TAKE 1 TABLET (100 MG TOTAL) BY MOUTH 2 (TWO) TIMES A DAY FOR 7 DAYS. NOT FOR PREGNANCY/LACTATION 07/04/18   [provider]  levonorgestrel (MIRENA) 20 MCG/24HR IUD by Intrauterine route.    [provider]  Levonorgestrel 19.5 MG IUD by Intrauterine route.    [provider]    Family History Family History  Problem Relation Age of Onset  . Thyroid disease Mother   . Pancreatic cancer Maternal Grandmother   .  Lung cancer Maternal Grandmother   . Liver cancer Maternal Grandmother   . Hypertension Maternal Grandmother   . Mitral valve prolapse Maternal Grandmother   . Heart disease Paternal Grandmother   . Hypertension Paternal Grandfather   . Parkinson's disease Paternal Grandfather   . Alzheimer's disease Paternal Grandfather     Social History Social History   Tobacco Use  . Smoking status: Current Every Day Smoker    Types: Cigarettes    Last attempt to quit: 11/16/2016    Years since quitting: 2.1  . Smokeless tobacco: Never Used  Substance Use Topics  . Alcohol use: No    Alcohol/week: 0.0 standard drinks  . Drug use: No     Allergies   Azithromycin, Iodine, Sulfa antibiotics, Prochlorperazine, Codeine, Cortizone-10 [hydrocortisone], and Lamictal [lamotrigine]   Review of Systems Review of Systems   Gastrointestinal: Positive for abdominal pain.  All other systems reviewed and are negative.    Physical Exam Updated Vital Signs BP (!) 122/99 (BP Location: Left Arm)   Pulse 76   Temp 98.5 F (36.9 C) (Oral)   Resp 18   Ht 5\' 4"  (1.626 m)   Wt 97.5 kg   SpO2 100%   BMI 36.90 kg/m   Physical Exam Vitals signs and nursing note reviewed.  Constitutional:      General: She is not in acute distress.    Appearance: She is well-developed.  HENT:     Head: Atraumatic.  Eyes:     Conjunctiva/sclera: Conjunctivae normal.  Neck:     Musculoskeletal: Neck supple.  Cardiovascular:     Rate and Rhythm: Normal rate and regular rhythm.     Pulses: Normal pulses.     Heart sounds: Normal heart sounds.  Pulmonary:     Effort: Pulmonary effort is normal.     Breath sounds: Normal breath sounds. No wheezing, rhonchi or rales.  Abdominal:     General: Abdomen is flat.     Palpations: Abdomen is soft.     Tenderness: There is abdominal tenderness (Left upper quadrant tenderness without guarding or rebound tenderness.  Negative Murphy sign, no pain at McBurney's point.). There is no right CVA tenderness or left CVA tenderness.  Musculoskeletal: Normal range of motion.  Skin:    Findings: No rash.  Neurological:     Mental Status: She is alert. Mental status is at baseline.  Psychiatric:        Mood and Affect: Mood normal.      ED Treatments / Results  Labs (all labs ordered are listed, but only abnormal results are displayed) Labs Reviewed  CBC WITH DIFFERENTIAL/PLATELET - Abnormal; Notable for the following components:      Result Value   WBC 11.7 (*)    All other components within normal limits  COMPREHENSIVE METABOLIC PANEL - Abnormal; Notable for the following components:   Calcium 10.5 (*)    Total Protein 8.4 (*)    All other components within normal limits  URINALYSIS, ROUTINE W REFLEX MICROSCOPIC - Abnormal; Notable for the following components:   Specific  Gravity, Urine >1.030 (*)    Hgb urine dipstick TRACE (*)    All other components within normal limits  URINALYSIS, MICROSCOPIC (REFLEX) - Abnormal; Notable for the following components:   Bacteria, UA MANY (*)    All other components within normal limits  LIPASE, BLOOD  PREGNANCY, URINE    EKG None  Radiology No results found.  Procedures Procedures (including critical care time)  Medications  Ordered in ED Medications  ondansetron (ZOFRAN) injection 4 mg (4 mg Intravenous Given 01/01/19 1556)  alum & mag hydroxide-simeth (MAALOX/MYLANTA) 200-200-20 MG/5ML suspension 30 mL (30 mLs Oral Given 01/01/19 1558)    And  lidocaine (XYLOCAINE) 2 % viscous mouth solution 15 mL (15 mLs Oral Given 01/01/19 1558)     Initial Impression / Assessment and Plan / ED Course  I have reviewed the triage vital signs and the nursing notes.  Pertinent labs & imaging results that were available during my care of the patient were reviewed by me and considered in my medical decision making (see chart for details).        BP (!) 122/99 (BP Location: Left Arm)   Pulse 76   Temp 98.5 F (36.9 C) (Oral)   Resp 18   Ht 5\' 4"  (1.626 m)   Wt 97.5 kg   SpO2 100%   BMI 36.90 kg/m    Final Clinical Impressions(s) / ED Diagnoses   Final diagnoses:  Upper abdominal pain    ED Discharge Orders         Ordered    omeprazole (PRILOSEC) 20 MG capsule  2 times daily before meals     01/01/19 1701         3:37 PM Patient complaining of left upper quadrant abdominal pain since yesterday.  No chest pain trouble breathing coughing to suggest cardiopulmonary disease.  She does have some mild tenderness to her left upper quadrant on palpation.  She is afebrile, vital signs stable. Work up [employer]  GI cocktail given.   5:03 PM Other UA shows many bacteria patient denies any dysuria to suggest urinary tract infection.  Pregnancy test is negative.  White count is mildly elevated at 11.7, this is  nonspecific.  Her electrolytes are normal, normal lipase.  Gave GI cocktail however she did not notice any significant improvement.  We did discuss option of CT scan versus watchful waiting.  We agree watchful waiting would be helpful however patient understand to return if her condition worsen.  Have low suspicion for pancreatitis or other acute abdominal pathology.  Patient does not have any symptoms to suggest COVID-19 as well.   Domenic Moras, PA-C 01/01/19 1704    Tegeler, Gwenyth Allegra, MD 01/01/19 [phone]

## 2019-01-01 NOTE — ED Notes (Signed)
Patient states that she just provided a urine at urgent care and is unable to give one here  - she does not have a copy of her results

## 2019-01-01 NOTE — Discharge Instructions (Signed)
Take prilosec 30 minutes before each major meal for the next 2 weeks.  Return to the ER if you develop fever, persistent vomiting or worsening abdominal pain not relief with prilosec.

## 2019-01-02 ENCOUNTER — Emergency Department (HOSPITAL_BASED_OUTPATIENT_CLINIC_OR_DEPARTMENT_OTHER): Payer: 59

## 2019-01-02 ENCOUNTER — Encounter (HOSPITAL_BASED_OUTPATIENT_CLINIC_OR_DEPARTMENT_OTHER): Payer: Self-pay | Admitting: Emergency Medicine

## 2019-01-02 ENCOUNTER — Emergency Department (HOSPITAL_BASED_OUTPATIENT_CLINIC_OR_DEPARTMENT_OTHER)
Admission: EM | Admit: 2019-01-02 | Discharge: 2019-01-02 | Disposition: A | Discharge status: Home / Self Care | Payer: 59 | Attending: Emergency Medicine | Admitting: Emergency Medicine

## 2019-01-02 DIAGNOSIS — R1012 Left upper quadrant pain: Secondary | ICD-10-CM | POA: Insufficient documentation

## 2019-01-02 DIAGNOSIS — J45909 Unspecified asthma, uncomplicated: Secondary | ICD-10-CM | POA: Insufficient documentation

## 2019-01-02 DIAGNOSIS — F17210 Nicotine dependence, cigarettes, uncomplicated: Secondary | ICD-10-CM | POA: Insufficient documentation

## 2019-01-02 LAB — COMPREHENSIVE METABOLIC PANEL
ALT: 34 U/L (ref 0–44)
AST: 24 U/L (ref 15–41)
Albumin: 4.1 g/dL (ref 3.5–5.0)
Alkaline Phosphatase: 92 U/L (ref 38–126)
Anion gap: 11 (ref 5–15)
BUN: 10 mg/dL (ref 6–20)
CO2: 28 mmol/L (ref 22–32)
Calcium: 10.5 mg/dL — ABNORMAL HIGH (ref 8.9–10.3)
Chloride: 100 mmol/L (ref 98–111)
Creatinine, Ser: 0.7 mg/dL (ref 0.44–1.00)
GFR calc Af Amer: >60 mL/min (ref >60)
GFR calc non Af Amer: >60 mL/min (ref >60)
Glucose, Bld: 104 mg/dL — ABNORMAL HIGH (ref 70–99)
Potassium: 3.5 mmol/L (ref 3.5–5.1)
Sodium: 139 mmol/L (ref 135–145)
Total Bilirubin: 0.9 mg/dL (ref 0.3–1.2)
Total Protein: 8.2 g/dL — ABNORMAL HIGH (ref 6.5–8.1)

## 2019-01-02 LAB — CBC WITH DIFFERENTIAL/PLATELET
Abs Immature Granulocytes: 0.03 10*3/uL (ref 0.00–0.07)
Basophils Absolute: 0.1 10*3/uL (ref 0.0–0.1)
Basophils Relative: 1 %
Eosinophils Absolute: 0.2 10*3/uL (ref 0.0–0.5)
Eosinophils Relative: 2 %
HCT: 40.7 % (ref 36.0–46.0)
Hemoglobin: 13.6 g/dL (ref 12.0–15.0)
Immature Granulocytes: 0 %
Lymphocytes Relative: 32 %
Lymphs Abs: 3.2 10*3/uL (ref 0.7–4.0)
MCH: 28 pg (ref 26.0–34.0)
MCHC: 33.4 g/dL (ref 30.0–36.0)
MCV: 83.7 fL (ref 80.0–100.0)
Monocytes Absolute: 0.5 10*3/uL (ref 0.1–1.0)
Monocytes Relative: 5 %
Neutro Abs: 6 10*3/uL (ref 1.7–7.7)
Neutrophils Relative %: 60 %
Platelets: 351 10*3/uL (ref 150–400)
RBC: 4.86 MIL/uL (ref 3.87–5.11)
RDW: 12.2 % (ref 11.5–15.5)
WBC: 9.9 10*3/uL (ref 4.0–10.5)
nRBC: 0 % (ref 0.0–0.2)

## 2019-01-02 LAB — URINALYSIS, ROUTINE W REFLEX MICROSCOPIC
Bilirubin Urine: NEGATIVE
Glucose, UA: NEGATIVE mg/dL
Ketones, ur: NEGATIVE mg/dL
Nitrite: NEGATIVE
Protein, ur: NEGATIVE mg/dL
Specific Gravity, Urine: 1.015 (ref 1.005–1.030)
pH: 6 (ref 5.0–8.0)

## 2019-01-02 LAB — URINALYSIS, MICROSCOPIC (REFLEX)

## 2019-01-02 LAB — LIPASE, BLOOD: Lipase: 43 U/L (ref 11–51)

## 2019-01-02 MED ORDER — ONDANSETRON HCL 4 MG PO TABS: 4.0000 mg | ORAL_TABLET | Freq: Three times a day (TID) | ORAL | 0 refills | Status: DC | PRN

## 2019-01-02 MED ORDER — SODIUM CHLORIDE 0.9 % IV BOLUS
1000.0000 mL | Freq: Once | INTRAVENOUS | Status: AC
  Administered 2019-01-02: 1000 mL via INTRAVENOUS

## 2019-01-02 MED ORDER — MORPHINE SULFATE (PF) 4 MG/ML IV SOLN: 4.0000 mg | Freq: Once | INTRAVENOUS | Status: DC

## 2019-01-02 MED ORDER — ONDANSETRON HCL 4 MG/2ML IJ SOLN
4.0000 mg | Freq: Once | INTRAMUSCULAR | Status: AC
  Administered 2019-01-02: 4 mg via INTRAVENOUS
  Filled 2019-01-02: qty 2

## 2019-01-02 NOTE — ED Notes (Signed)
Pt with documented allergy to iv contrast, spoke with Domenic Moras PA- gave verbal order to change exam to Abdomen and pelvis without contrast media

## 2019-01-02 NOTE — Discharge Instructions (Signed)
Please call and follow up with your primary care doctor or with GI specialist for further evaluation of your abdominal pain. Take zofran as needed for nausea.  Continue taking prilosec 30 minutes before each major meal.  Return if you have concerns.

## 2019-01-02 NOTE — ED Provider Notes (Signed)
[facility] EMERGENCY DEPARTMENT Provider Note   CSN: [id number] Arrival date & time: 01/02/19  1242     History   Chief Complaint Chief Complaint  Patient presents with  . Abdominal Pain    HPI Emily Chavez is a 28 y.o. female.     The history is provided by the patient and medical records. No language interpreter was used.  Abdominal Pain    28 year old female with history of IBS, bipolar, depression, presenting for evaluation of abdominal pain.  Patient here with left upper abdominal pain that radiates to her back and left shoulder which began 2 days ago.  Pain has been persistent with associated nausea and nonbloody vomiting.  She was seen yesterday for her complaint.  I was the one that evaluate her.  At that time her labs are reassuring, and it was decided that we should have serial abdominal exam prior to advanced imaging.  Patient in agreement.  Patient went home, ate food but subsequently developed worsening abdominal pain and nausea.  This a.m., she tries eating which aggravate her abdominal discomfort thus prompting her to come to the ER.  She did try using Prilosec and Tums at home without adequate relief.  Patient did recall having a sinus infection 2 weeks ago and was treated with antibiotic.  She also report have been tested for COVID-19 2 weeks ago which came back negative.  Past Medical History:  Diagnosis Date  . Asthma    exercise induced  . Bipolar 1 disorder ([place])   . Concussion 09/2012  . Depression   . IBS (irritable bowel syndrome)   . Migraines    with aura    Patient Active Problem List   Diagnosis Date Noted  . Environmental and seasonal allergies 01/11/2018  . Anxiety 01/23/2017  . Migraine 01/23/2017  . Vitamin D deficiency 03/27/2015  . Bipolar I disorder ([place])   . Bipolar I disorder, most recent episode (or current) depressed, in partial or unspecified remission 02/04/2011    Past Surgical History:  Procedure Laterality Date   . APPENDECTOMY  age 46  . skyla insertion  03-30-13  . TYMPANOSTOMY TUBE PLACEMENT    . WISDOM TOOTH EXTRACTION       OB History    Gravida  0   Para  0   Term  0   Preterm  0   AB  0   Living  0     SAB  0   TAB  0   Ectopic  0   Multiple  0   Live Births               Home Medications    Prior to Admission medications   Medication Sig Start Date End Date Taking? Authorizing Provider  cetirizine (ZYRTEC) 10 MG tablet Take by mouth.    [provider]  cetirizine (ZYRTEC) 10 MG tablet Take by mouth.    [provider]  divalproex (DEPAKOTE) 500 MG DR tablet Take by mouth. Take 500mg  one day and 1,000 the next day, rotating    [provider]  doxycycline (VIBRA-TABS) 100 MG tablet TAKE 1 TABLET (100 MG TOTAL) BY MOUTH 2 (TWO) TIMES A DAY FOR 7 DAYS. NOT FOR PREGNANCY/LACTATION 07/04/18   [provider]  levonorgestrel (MIRENA) 20 MCG/24HR IUD by Intrauterine route.    [provider]  Levonorgestrel 19.5 MG IUD by Intrauterine route.    [provider]  omeprazole (PRILOSEC) 20 MG capsule Take  1 capsule (20 mg total) by mouth 2 (two) times daily before a meal. 01/01/19   Domenic Moras, PA-C    Family History Family History  Problem Relation Age of Onset  . Thyroid disease Mother   . Pancreatic cancer Maternal Grandmother   . Lung cancer Maternal Grandmother   . Liver cancer Maternal Grandmother   . Hypertension Maternal Grandmother   . Mitral valve prolapse Maternal Grandmother   . Heart disease Paternal Grandmother   . Hypertension Paternal Grandfather   . Parkinson's disease Paternal Grandfather   . Alzheimer's disease Paternal Grandfather     Social History Social History   Tobacco Use  . Smoking status: Current Every Day Smoker    Types: Cigarettes    Last attempt to quit: 11/16/2016    Years since quitting: 2.1  . Smokeless tobacco: Never Used  Substance Use Topics  . Alcohol use: No     Alcohol/week: 0.0 standard drinks  . Drug use: No     Allergies   Azithromycin, Iodine, Sulfa antibiotics, Prochlorperazine, Codeine, Cortizone-10 [hydrocortisone], and Lamictal [lamotrigine]   Review of Systems Review of Systems  Gastrointestinal: Positive for abdominal pain.  All other systems reviewed and are negative.    Physical Exam Updated Vital Signs BP 124/88 (BP Location: Left Arm)   Pulse (!) 102   Temp 98.9 F (37.2 C) (Oral)   Resp 18   Ht 5\' 4"  (1.626 m)   Wt 97.5 kg   SpO2 100%   BMI 36.90 kg/m   Physical Exam Vitals signs and nursing note reviewed.  Constitutional:      General: She is not in acute distress.    Appearance: She is well-developed.  HENT:     Head: Atraumatic.  Eyes:     Conjunctiva/sclera: Conjunctivae normal.  Neck:     Musculoskeletal: Neck supple.  Cardiovascular:     Rate and Rhythm: Normal rate and regular rhythm.  Pulmonary:     Effort: Pulmonary effort is normal.     Breath sounds: Normal breath sounds.  Abdominal:     General: Abdomen is flat. Bowel sounds are normal.     Palpations: Abdomen is soft.     Tenderness: There is abdominal tenderness in the left upper quadrant. There is no right CVA tenderness, left CVA tenderness, guarding or rebound. Negative signs include Murphy's sign, Rovsing's sign and McBurney's sign.  Skin:    Findings: No rash.  Neurological:     Mental Status: She is alert.      ED Treatments / Results  Labs (all labs ordered are listed, but only abnormal results are displayed) Labs Reviewed  COMPREHENSIVE METABOLIC PANEL - Abnormal; Notable for the following components:      Result Value   Glucose, Bld 104 (*)    Calcium 10.5 (*)    Total Protein 8.2 (*)    All other components within normal limits  URINALYSIS, ROUTINE W REFLEX MICROSCOPIC - Abnormal; Notable for the following components:   Hgb urine dipstick TRACE (*)    Leukocytes,Ua TRACE (*)    All other components within normal  limits  URINALYSIS, MICROSCOPIC (REFLEX) - Abnormal; Notable for the following components:   Bacteria, UA MANY (*)    All other components within normal limits  CBC WITH DIFFERENTIAL/PLATELET  LIPASE, BLOOD    EKG None  Radiology Ct Abdomen Pelvis Wo Contrast  Result Date: 01/02/2019 CLINICAL DATA:  Acute abdominal pain.  LEFT upper quadrant pain. EXAM: CT ABDOMEN AND PELVIS WITHOUT  CONTRAST TECHNIQUE: Multidetector CT imaging of the abdomen and pelvis was performed following the standard protocol without IV contrast. COMPARISON:  None. FINDINGS: Lower chest: Lung bases are clear. Hepatobiliary: No focal hepatic lesion. No biliary duct dilatation. Gallbladder is normal. Common bile duct is normal. Pancreas: Pancreas is normal. No ductal dilatation. No pancreatic inflammation. Spleen: From Adrenals/urinary tract: Adrenal glands and kidneys are normal. The ureters and bladder normal. Stomach/Bowel: Stomach, small-bowel and cecum are normal. The appendix is not identified but there is no pericecal inflammation to suggest appendicitis. The colon and rectosigmoid colon are normal. Vascular/Lymphatic: Abdominal aorta is normal caliber. No periportal or retroperitoneal adenopathy. No pelvic adenopathy. Reproductive: IUD in expected location.  Uterus and ovaries normal. Other: No free fluid. Musculoskeletal: No aggressive osseous lesion. IMPRESSION: 1. No explanation for LEFT lower quadrant pain.  Normal spleen. 2. No acute abdominopelvic findings. Electronically Signed   By: Suzy Bouchard M.D.   On: 01/02/2019 15:10   US Abdomen Limited Ruq  Result Date: 01/02/2019 CLINICAL DATA:  LEFT upper quadrant pain for 3 days. EXAM: ULTRASOUND ABDOMEN LIMITED RIGHT UPPER QUADRANT COMPARISON:  None. FINDINGS: Gallbladder: Contracted, limiting characterization. No gallstones seen. No gallbladder wall thickening, pericholecystic fluid or other signs of acute cholecystitis. No sonographic Murphy's sign elicited  during the exam. Common bile duct: Diameter: 3 mm Liver: Liver is diffusely echogenic indicating fatty infiltration. No focal liver abnormality identified. Portal vein is patent on color Doppler imaging with normal direction of blood flow towards the liver. Other: None. IMPRESSION: 1. No evidence of cholecystitis.  No bile duct dilatation. 2. Fatty infiltration of the liver. Electronically Signed   By: Franki Cabot M.D.   On: 01/02/2019 14:17    Procedures Procedures (including critical care time)  Medications Ordered in ED Medications  morphine 4 MG/ML injection 4 mg (4 mg Intravenous Not Given 01/02/19 1319)  ondansetron (ZOFRAN) injection 4 mg (4 mg Intravenous Given 01/02/19 1324)  sodium chloride 0.9 % bolus 1,000 mL (1,000 mLs Intravenous New Bag/Given 01/02/19 1321)     Initial Impression / Assessment and Plan / ED Course  I have reviewed the triage vital signs and the nursing notes.  Pertinent labs & imaging results that were available during my care of the patient were reviewed by me and considered in my medical decision making (see chart for details).        BP 124/88 (BP Location: Left Arm)   Pulse (!) 102   Temp 98.9 F (37.2 C) (Oral)   Resp 18   Ht 5\' 4"  (1.626 m)   Wt 97.5 kg   SpO2 100%   BMI 36.90 kg/m    Final Clinical Impressions(s) / ED Diagnoses   Final diagnoses:  LUQ pain    ED Discharge Orders         Ordered    ondansetron (ZOFRAN) 4 MG tablet  Every 8 hours PRN     01/02/19 1549         1:10 PM Patient with abdominal pain for the past several days worsened with eating.  She was seen by me yesterday for complaint.  Her labs at that time was unremarkable.  We opted for serial exam which prompted her ER visit today.  On reexamination, abdomen soft and mild tenderness to left upper quadrant without any significant right upper quadrant tenderness however, this time will obtain limited abdominal ultrasound abdominal pelvis CT scan if ultrasound is  negative.  2:37 PM Limited abd US unremarkable, no gallbladder pathology.  Labs are reassuring.  UA with trace Hgb and many bacteria but pt denies dysuria.  Due to persistent concern about her abdominal discomfort, will obtain abd/pelvis CT  3:48 PM Colonoscopy CT scan studies normal.  At this time I encourage patient to follow-up with her PCP for further evaluation.  She may follow-up with GI specialist as well if needed.  Return precautions discussed.   Domenic Moras, PA-C 01/02/19 [place], [place], DO 01/04/19 2350

## 2019-01-02 NOTE — ED Triage Notes (Signed)
Seen yesterday for LUQ pain. Pain persists.

## 2019-01-04 ENCOUNTER — Encounter (HOSPITAL_COMMUNITY): Payer: Self-pay

## 2019-01-04 ENCOUNTER — Other Ambulatory Visit: Payer: Self-pay

## 2019-01-04 ENCOUNTER — Emergency Department (HOSPITAL_COMMUNITY)
Admission: EM | Admit: 2019-01-04 | Discharge: 2019-01-04 | Disposition: A | Discharge status: Home / Self Care | Payer: No Typology Code available for payment source | Attending: Emergency Medicine | Admitting: Emergency Medicine

## 2019-01-04 DIAGNOSIS — J45909 Unspecified asthma, uncomplicated: Secondary | ICD-10-CM | POA: Diagnosis not present

## 2019-01-04 DIAGNOSIS — Z7721 Contact with and (suspected) exposure to potentially hazardous body fluids: Secondary | ICD-10-CM | POA: Diagnosis not present

## 2019-01-04 DIAGNOSIS — F17210 Nicotine dependence, cigarettes, uncomplicated: Secondary | ICD-10-CM | POA: Insufficient documentation

## 2019-01-04 DIAGNOSIS — Z79899 Other long term (current) drug therapy: Secondary | ICD-10-CM | POA: Diagnosis not present

## 2019-01-04 LAB — RAPID HIV SCREEN (HIV 1/2 AB+AG)
HIV 1/2 Antibodies: NONREACTIVE
HIV-1 P24 Antigen - HIV24: NONREACTIVE

## 2019-01-04 NOTE — ED Provider Notes (Signed)
[facility] EMERGENCY DEPARTMENT Provider Note   CSN: [id number] Arrival date & time: 01/04/19  1709    History   Chief Complaint Chief Complaint  Patient presents with  . Body Fluid Exposure    HPI Emily Chavez is a 28 y.o. female.     Emily Chavez is a 28 y.o. female with a history of migraines, IBS, asthma and depression, who presents for evaluation after blood exposure at work.  She reports that she works at a [employer] and today while at work got cut by a dental bar that was used in a patient's mouth.  Small superficial cut to the dorsal surface of the right fifth finger, which patient covered with bandage reports minimal pain and no bleeding.  Patient's chart was reviewed and patient had no known history of potential blood-borne diseases, patient was sent to the ED for exposure blood work.     Past Medical History:  Diagnosis Date  . Asthma    exercise induced  . Bipolar 1 disorder ([place])   . Concussion 09/2012  . Depression   . IBS (irritable bowel syndrome)   . Migraines    with aura    Patient Active Problem List   Diagnosis Date Noted  . Environmental and seasonal allergies 01/11/2018  . Anxiety 01/23/2017  . Migraine 01/23/2017  . Vitamin D deficiency 03/27/2015  . Bipolar I disorder ([place])   . Bipolar I disorder, most recent episode (or current) depressed, in partial or unspecified remission 02/04/2011    Past Surgical History:  Procedure Laterality Date  . APPENDECTOMY  age 73  . skyla insertion  03-30-13  . TYMPANOSTOMY TUBE PLACEMENT    . WISDOM TOOTH EXTRACTION       OB History    Gravida  0   Para  0   Term  0   Preterm  0   AB  0   Living  0     SAB  0   TAB  0   Ectopic  0   Multiple  0   Live Births               Home Medications    Prior to Admission medications   Medication Sig Start Date End Date Taking? Authorizing Provider  cetirizine (ZYRTEC) 10 MG tablet Take by mouth.    [provider]  cetirizine (ZYRTEC) 10 MG tablet Take by mouth.    [provider]  divalproex (DEPAKOTE) 500 MG DR tablet Take by mouth. Take 500mg  one day and 1,000 the next day, rotating    [provider]  doxycycline (VIBRA-TABS) 100 MG tablet TAKE 1 TABLET (100 MG TOTAL) BY MOUTH 2 (TWO) TIMES A DAY FOR 7 DAYS. NOT FOR PREGNANCY/LACTATION 07/04/18   [provider]  levonorgestrel (MIRENA) 20 MCG/24HR IUD by Intrauterine route.    [provider]  Levonorgestrel 19.5 MG IUD by Intrauterine route.    [provider]  omeprazole (PRILOSEC) 20 MG capsule Take 1 capsule (20 mg total) by mouth 2 (two) times daily before a meal. 01/01/19   Domenic Moras, PA-C  ondansetron (ZOFRAN) 4 MG tablet Take 1 tablet (4 mg total) by mouth every 8 (eight) hours as needed for nausea or vomiting. 01/02/19   Domenic Moras, PA-C    Family History Family History  Problem Relation Age of Onset  . Thyroid disease Mother   . Pancreatic cancer Maternal Grandmother   . Lung cancer Maternal Grandmother   .  Liver cancer Maternal Grandmother   . Hypertension Maternal Grandmother   . Mitral valve prolapse Maternal Grandmother   . Heart disease Paternal Grandmother   . Hypertension Paternal Grandfather   . Parkinson's disease Paternal Grandfather   . Alzheimer's disease Paternal Grandfather     Social History Social History   Tobacco Use  . Smoking status: Current Every Day Smoker    Types: Cigarettes    Last attempt to quit: 11/16/2016    Years since quitting: 2.1  . Smokeless tobacco: Never Used  Substance Use Topics  . Alcohol use: No    Alcohol/week: 0.0 standard drinks  . Drug use: No     Allergies   Azithromycin, Iodine, Sulfa antibiotics, Prochlorperazine, Codeine, Cortizone-10 [hydrocortisone], and Lamictal [lamotrigine]   Review of Systems Review of Systems  Constitutional: Negative for chills and fever.  Skin: Positive for wound.     Physical  Exam Updated Vital Signs BP 111/70 (BP Location: Right Arm)   Pulse (!) 110   Resp 12   Ht 5\' 4"  (1.626 m)   Wt 97.5 kg   SpO2 98%   BMI 36.90 kg/m   Physical Exam Vitals signs and nursing note reviewed.  Constitutional:      General: She is not in acute distress.    Appearance: Normal appearance. She is well-developed and normal weight. She is not diaphoretic.  HENT:     Head: Normocephalic and atraumatic.  Eyes:     General:        Right eye: No discharge.        Left eye: No discharge.  Pulmonary:     Effort: Pulmonary effort is normal. No respiratory distress.  Musculoskeletal:     Comments: Superficial 1.5 cm cut to the dorsum of the right 5th finger just about MCP, would already healing with no surrounding erythema, ROM of finger normal, good cap refill  Skin:    General: Skin is warm and dry.  Neurological:     Mental Status: She is alert.     Coordination: Coordination normal.  Psychiatric:        Mood and Affect: Affect normal. Mood is anxious.        Behavior: Behavior normal.      ED Treatments / Results  Labs (all labs ordered are listed, but only abnormal results are displayed) Labs Reviewed  RAPID HIV SCREEN (HIV 1/2 AB+AG)  HEPATITIS PANEL, ACUTE    EKG None  Radiology No results found.  Procedures Procedures (including critical care time)  Medications Ordered in ED Medications - No data to display   Initial Impression / Assessment and Plan / ED Course  I have reviewed the triage vital signs and the nursing notes.  Pertinent labs & imaging results that were available during my care of the patient were reviewed by me and considered in my medical decision making (see chart for details).  28 year old female presents after blood exposure from patient at dental office at work today.  Has a small superficial laceration to the right fifth finger, no laceration repair required.  Blood exposure panel sent, discussed with patient that she would be  called in a few days with results, patient with no known blood-borne diseases, so no postexposure prophylaxis indicated at this time.  Patient expresses understanding and agreement.  Discharged home in good condition.  Final Clinical Impressions(s) / ED Diagnoses   Final diagnoses:  Exposure to blood or body fluid    ED Discharge Orders  None       Jacqlyn Larsen, [place] 01/04/19 1759    Daleen Bo, MD 01/04/19 [id number]

## 2019-01-04 NOTE — Discharge Instructions (Signed)
Exposure blood work was sent off today and you will be called with results in a few days.  You do not need any prophylactic treatment since the patient did not have any known history of infectious diseases.

## 2019-01-04 NOTE — ED Triage Notes (Signed)
Pt works at a [employer] and was stuck with a contaminated needle today. Work told her to come and have blood work done. NAD.

## 2019-01-05 LAB — HEPATITIS PANEL, ACUTE
HCV Ab: 0.1 s/co ratio (ref 0.0–0.9)
Hep A IgM: NEGATIVE
Hep B C IgM: NEGATIVE
Hepatitis B Surface Ag: NEGATIVE

## 2019-08-01 ENCOUNTER — Encounter: Payer: Self-pay | Admitting: Certified Nurse Midwife

## 2019-08-03 ENCOUNTER — Encounter: Payer: Self-pay | Admitting: Certified Nurse Midwife

## 2020-06-26 DIAGNOSIS — K7581 Nonalcoholic steatohepatitis (NASH): Secondary | ICD-10-CM | POA: Insufficient documentation

## 2020-07-13 ENCOUNTER — Encounter: Payer: Self-pay | Admitting: Obstetrics and Gynecology

## 2020-07-13 ENCOUNTER — Ambulatory Visit (INDEPENDENT_AMBULATORY_CARE_PROVIDER_SITE_OTHER): Payer: BC Managed Care – PPO | Admitting: Obstetrics and Gynecology

## 2020-07-13 ENCOUNTER — Other Ambulatory Visit: Payer: Self-pay

## 2020-07-13 VITALS — BP 116/70 | HR 110 | Ht 64.0 in | Wt 227.0 lb

## 2020-07-13 DIAGNOSIS — Z01419 Encounter for gynecological examination (general) (routine) without abnormal findings: Secondary | ICD-10-CM

## 2020-07-13 DIAGNOSIS — R197 Diarrhea, unspecified: Secondary | ICD-10-CM | POA: Insufficient documentation

## 2020-07-13 DIAGNOSIS — Z23 Encounter for immunization: Secondary | ICD-10-CM

## 2020-07-13 DIAGNOSIS — Z3009 Encounter for other general counseling and advice on contraception: Secondary | ICD-10-CM | POA: Diagnosis not present

## 2020-07-13 DIAGNOSIS — K580 Irritable bowel syndrome with diarrhea: Secondary | ICD-10-CM | POA: Insufficient documentation

## 2020-07-13 DIAGNOSIS — R635 Abnormal weight gain: Secondary | ICD-10-CM | POA: Insufficient documentation

## 2020-07-13 DIAGNOSIS — K600 Acute anal fissure: Secondary | ICD-10-CM | POA: Insufficient documentation

## 2020-07-13 DIAGNOSIS — K760 Fatty (change of) liver, not elsewhere classified: Secondary | ICD-10-CM | POA: Insufficient documentation

## 2020-07-13 DIAGNOSIS — Z30431 Encounter for routine checking of intrauterine contraceptive device: Secondary | ICD-10-CM | POA: Diagnosis not present

## 2020-07-13 MED ORDER — NORETHIN ACE-ETH ESTRAD-FE 1-20 MG-MCG PO TABS: 1.0000 | ORAL_TABLET | Freq: Every day | ORAL | 0 refills | Status: DC

## 2020-07-13 NOTE — Patient Instructions (Addendum)
EXERCISE   We recommended that you start or continue a regular exercise program for good health. Physical activity is anything that gets your body moving, some is better than none. The CDC recommends 150 minutes per week of Moderate-Intensity Aerobic Activity and 2 or more days of Muscle Strengthening Activity.  Benefits of exercise are limitless: helps weight loss/weight maintenance, improves mood and energy, helps with depression and anxiety, improves sleep, tones and strengthens muscles, improves balance, improves bone density, protects from chronic conditions such as heart disease, high blood pressure and diabetes and so much more. To learn more visit: https://www.cdc.gov/physicalactivity/index.html  DIET: Good nutrition starts with a healthy diet of fruits, vegetables, whole grains, and lean protein sources. Drink plenty of water for hydration. Minimize empty calories, sodium, sweets. For more information about dietary recommendations visit: https://health.gov/our-work/nutrition-physical-activity/dietary-guidelines and https://www.myplate.gov/  ALCOHOL:  Women should limit their alcohol intake to no more than 7 drinks/beers/glasses of wine (combined, not each!) per week. Moderation of alcohol intake to this level decreases your risk of breast cancer and liver damage.  If you are concerned that you may have a problem, or your friends have told you they are concerned about your drinking, there are many resources to help. A well-known program that is free, effective, and available to all people all over the nation is Alcoholics Anonymous.  Check out this site to learn more: https://www.aa.org/   CALCIUM AND VITAMIN D:  Adequate intake of calcium and Vitamin D are recommended for bone health.  You should be getting between 1000-1200 mg of calcium and 800 units of Vitamin D daily between diet and supplements  PAP SMEARS:  Pap smears, to check for cervical cancer or precancers,  have traditionally been  done yearly, scientific advances have shown that most women can have pap smears less often.  However, every woman still should have a physical exam from her gynecologist every year. It will include a breast check, inspection of the vulva and vagina to check for abnormal growths or skin changes, a visual exam of the cervix, and then an exam to evaluate the size and shape of the uterus and ovaries. We will also provide age appropriate advice regarding health maintenance, like when you should have certain vaccines, screening for sexually transmitted diseases, bone density testing, colonoscopy, mammograms, etc.   MAMMOGRAMS:  All women over 40 years old should have a routine mammogram.   COLON CANCER SCREENING: Now recommend starting at age 45. At this time colonoscopy is not covered for routine screening until 50. There are take home tests that can be done between 45-49.   COLONOSCOPY:  Colonoscopy to screen for colon cancer is recommended for all women at age 50.  We know, you hate the idea of the prep.  We agree, BUT, having colon cancer and not knowing it is worse!!  Colon cancer so often starts as a polyp that can be seen and removed at colonscopy, which can quite literally save your life!  And if your first colonoscopy is normal and you have no family history of colon cancer, most women don't have to have it again for 10 years.  Once every ten years, you can do something that may end up saving your life, right?  We will be happy to help you get it scheduled when you are ready.  Be sure to check your insurance coverage so you understand how much it will cost.  It may be covered as a preventative service at no cost, but you should check   your particular policy.      Breast Self-Awareness Breast self-awareness means being familiar with how your breasts look and feel. It involves checking your breasts regularly and reporting any changes to your health care provider. Practicing breast self-awareness is  important. A change in your breasts can be a sign of a serious medical problem. Being familiar with how your breasts look and feel allows you to find any problems early, when treatment is more likely to be successful. All women should practice breast self-awareness, including women who have had breast implants. How to do a breast self-exam One way to learn what is normal for your breasts and whether your breasts are changing is to do a breast self-exam. To do a breast self-exam: Look for Changes  1. Remove all the clothing above your waist. 2. Stand in front of a mirror in a room with good lighting. 3. Put your hands on your hips. 4. Push your hands firmly downward. 5. Compare your breasts in the mirror. Look for differences between them (asymmetry), such as: ? Differences in shape. ? Differences in size. ? Puckers, dips, and bumps in one breast and not the other. 6. Look at each breast for changes in your skin, such as: ? Redness. ? Scaly areas. 7. Look for changes in your nipples, such as: ? Discharge. ? Bleeding. ? Dimpling. ? Redness. ? A change in position. Feel for Changes Carefully feel your breasts for lumps and changes. It is best to do this while lying on your back on the floor and again while sitting or standing in the shower or tub with soapy water on your skin. Feel each breast in the following way:  Place the arm on the side of the breast you are examining above your head.  Feel your breast with the other hand.  Start in the nipple area and make  inch (2 cm) overlapping circles to feel your breast. Use the pads of your three middle fingers to do this. Apply light pressure, then medium pressure, then firm pressure. The light pressure will allow you to feel the tissue closest to the skin. The medium pressure will allow you to feel the tissue that is a little deeper. The firm pressure will allow you to feel the tissue close to the ribs.  Continue the overlapping circles,  moving downward over the breast until you feel your ribs below your breast.  Move one finger-width toward the center of the body. Continue to use the  inch (2 cm) overlapping circles to feel your breast as you move slowly up toward your collarbone.  Continue the up and down exam using all three pressures until you reach your armpit.  Write Down What You Find  Write down what is normal for each breast and any changes that you find. Keep a written record with breast changes or normal findings for each breast. By writing this information down, you do not need to depend only on memory for size, tenderness, or location. Write down where you are in your menstrual cycle, if you are still menstruating. If you are having trouble noticing differences in your breasts, do not get discouraged. With time you will become more familiar with the variations in your breasts and more comfortable with the exam. How often should I examine my breasts? Examine your breasts every month. If you are breastfeeding, the best time to examine your breasts is after a feeding or after using a breast pump. If you menstruate, the best time to   examine your breasts is 5-7 days after your period is over. During your period, your breasts are lumpier, and it may be more difficult to notice changes. When should I see my health care provider? See your health care provider if you notice:  A change in shape or size of your breasts or nipples.  A change in the skin of your breast or nipples, such as a reddened or scaly area.  Unusual discharge from your nipples.  A lump or thick area that was not there before.  Pain in your breasts.  Anything that concerns you.  Oral Contraception Information Oral contraceptive pills (OCPs) are medicines taken by mouth to prevent pregnancy. They work by:  Preventing the ovaries from releasing eggs.  Thickening mucus in the lower part of the uterus (cervix). This prevents sperm from entering the  uterus.  Thinning the lining of the uterus (endometrium). This prevents a fertilized egg from attaching to the endometrium. OCPs are highly effective when taken exactly as prescribed. However, OCPs do not prevent STIs (sexually transmitted infections). Using condoms while on an OCP can help prevent STIs. What happens before starting OCPs? Before you start taking OCPs:  You may have a physical exam, blood test, and Pap test.  Your health care provider will make sure you are a good candidate for oral contraception. OCPs are not a good option for certain women, such as: ? Women who smoke and are older than age 35. ? Women who have or have had certain conditions, such as:  A history of high blood pressure.  Deep vein thrombosis.  Pulmonary embolism.  Stroke.  Cardiovascular disease.  Peripheral vascular disease. Ask your health care provider about the possible side effects of the OCP you may be prescribed. Be aware that it can take 2-3 months for your body to adjust to changes in hormone levels. Types of oral contraception Birth control pills contain the hormones estrogen and progestin (synthetic progesterone) or progestin only. The combination pill This type of pill contains estrogen and progestin hormones.  Conventional contraception pills come in packs of 21 or 28 pills. ? Some packs with 28-day pills contain estrogen and progestin for the first 21-24 days. Hormone-free tablets, called placebos, are taken for the final 4-7 days. You should have menstrual bleeding during the time you take the placebos. ? In packs with 21 tablets, you take no pills for 7 days. Menstrual bleeding occurs during these days. (Some people prefer taking a pill for 28 days to help establish a routine).  Extended-interval contraception pills come in packs of 91 pills. The first 84 tablets have both estrogen and progestin. The last 7 pills are placebos. Menstrual bleeding occurs during the placebo days. With  this schedule, menstrual bleeding happens once every 3 months.  Continuous contraception pills come in packs of 28 pills. All pills in the pack contain estrogen and progestin. With this schedule, regular menstrual bleeding does not happen, but there may be spotting or irregular bleeding. Progestin-only pills This type of pill is often called the mini-pill and contains the progestin hormone only. It comes in packs of 28 pills. In some packs, the last 4 pills are placebos. The pill must be taken at the same time every day. This is very important to prevent pregnancy. Menstrual bleeding may not be regular or predictable.   What are the advantages? Oral contraception provides reliable and continuous contraception if taken as directed. It may treat or decrease symptoms of:  Menstrual period cramps.  Irregular   menstrual cycle or bleeding.  Heavy menstrual flow.  Abnormal uterine bleeding.  Acne, depending on the type of pill.  Polycystic ovarian syndrome (POS).  Endometriosis.  Iron deficiency anemia.  Premenstrual symptoms, including severe irritability, depression, or anxiety. It also may:  Reduce the risk of endometrial and ovarian cancer.  Be used as emergency contraception.  Prevent ectopic pregnancies and infections of the fallopian tubes. What can make OCPs less effective? OCPs may be less effective if:  You forget to take the pill every day. For progestin-only pills, it is especially important to take the pill at the same time each day. Even taking it 3 hours late can increase the risk of pregnancy.  You have a stomach or intestinal disease that reduces your body's ability to absorb the pill.  You take OCPs with other medicines that make OCPs less effective, such as antibiotics, certain HIV medicines, and some seizure medicines.  You take expired OCPs.  You forget to restart the pill after 7 days of not taking it. This refers to the packs of 21 pills. What are the side  effects and risks? OCPs can sometimes cause side effects, such as:  Headache.  Depression.  Trouble sleeping.  Nausea and vomiting.  Breast tenderness.  Irregular bleeding or spotting during the first several months.  Bloating or fluid retention.  Increase in blood pressure. Combination pills may slightly increase the risk of:  Blood clots.  Heart attack.  Stroke. Follow these instructions at home: Follow instructions from your health care provider about how to start taking your first cycle of OCPs. Depending on when you start the pill, you may need to use a backup form of birth control, such as condoms, during the first week. Make sure you know what steps to take if you forget to take the pill. Summary  Oral contraceptive pills (OCPs) are medicines taken by mouth to prevent pregnancy. They are highly effective when taken exactly as prescribed.  OCPs contain a combination of the hormones estrogen and progestin (synthetic progesterone) or progestin only.  Before you start taking the pill, you may have a physical exam, blood test, and Pap test. Your health care provider will make sure you are a good candidate for oral contraception.  The combination pill may come in a 21-day pack, a 28-day pack, or a 91-day pack. Progestin-only pills come in packs of 28 pills.  OCPs can sometimes cause side effects, such as headache, nausea, breast tenderness, or irregular bleeding. This information is not intended to replace advice given to you by your health care provider. Make sure you discuss any questions you have with your health care provider. Document Revised: 02/03/2020 Document Reviewed: 01/12/2020 Elsevier Patient Education  2021 [employer] Inc.  

## 2020-07-13 NOTE — Progress Notes (Signed)
30 y.o. G0P0000 Married White or Caucasian Not Hispanic or Latino female here for annual exam. Patient would like to discuss IUD and if it should be removed. She has a kyleena IUD, inserted in 6/17.   She just got married a few months ago. Considering having a baby, but wants to get her bipolar depression under control. She has a Teacher, music who is going to manage her medication and make adjustments in her medication prior to pregnancy. Considering pregnancy in the next year.  She has random and irregular bleeding with the kyleena. Can have a cycle monthly every month or spotting every 3 months. No dyspareunia.  Period Duration (Days): 3 Period Pattern: (!) Irregular Menstrual Flow: Light,Moderate Menstrual Control: Thin pad Menstrual Control Change Freq (Hours): 4-6 Dysmenorrhea: (!) Moderate Dysmenorrhea Symptoms: Cramping,Diarrhea  Patient's last menstrual period was 04/26/2020.          Sexually active: Yes.    The current method of family planning is IUD.    Exercising: No.  The patient does not participate in regular exercise at present. Smoker:  no  Health Maintenance: Pap:  07/16/18 Neg  01/23/17 Neg:Neg HR HPV History of abnormal Pap:  no MMG:  02/01/16 Left Breast US - BIRADS 2 benign Colonoscopy: 05/25/15 f/u at 50 TDaP:  12/07/09 Gardasil: completed   reports that she has been smoking cigarettes. She has never used smokeless tobacco. She reports that she does not drink alcohol and does not use drugs. Social smoker, rare. Rare ETOH. She works as a Art therapist. She has a 70 year old step daughter and 44 step son. The son lives with them full time, the daughter lives with her mom.    Past Medical History:  Diagnosis Date  . Asthma    exercise induced  . Bipolar 1 disorder ([place])   . Concussion 09/2012  . Depression   . IBS (irritable bowel syndrome)   Prior history reports migraines with aura, the patient denies a h/o migraines or auras.   Past Surgical History:   Procedure Laterality Date  . APPENDECTOMY  age 81  . skyla insertion  03-30-13  . TYMPANOSTOMY TUBE PLACEMENT    . WISDOM TOOTH EXTRACTION      Current Outpatient Medications  Medication Sig Dispense Refill  . ALPRAZolam (XANAX) 0.5 MG tablet Take by mouth.    . cetirizine (ZYRTEC) 10 MG tablet Take by mouth.    . cetirizine (ZYRTEC) 10 MG tablet Take by mouth.    . divalproex (DEPAKOTE) 500 MG DR tablet Take by mouth. Take 500mg  one day and 1,000 the next day, rotating    . levonorgestrel (MIRENA) 20 MCG/24HR IUD by Intrauterine route.    . lurasidone (LATUDA) 20 MG TABS tablet Take by mouth.    . gabapentin (NEURONTIN) 100 MG capsule SMARTSIG:1-3 Capsule(s) By Mouth PRN (Patient not taking: Reported on 07/13/2020)     No current facility-administered medications for this visit.    Family History  Problem Relation Age of Onset  . Thyroid disease Mother   . Pancreatic cancer Maternal Grandmother   . Lung cancer Maternal Grandmother   . Liver cancer Maternal Grandmother   . Hypertension Maternal Grandmother   . Mitral valve prolapse Maternal Grandmother   . Heart disease Paternal Grandmother   . Hypertension Paternal Grandfather   . Parkinson's disease Paternal Grandfather   . Alzheimer's disease Paternal Grandfather     Review of Systems  Constitutional: Negative.   HENT: Negative.   Eyes: Negative.  Respiratory: Negative.   Cardiovascular: Negative.   Gastrointestinal: Negative.   Endocrine: Negative.   Genitourinary: Negative.   Musculoskeletal: Negative.   Skin: Negative.   Allergic/Immunologic: Negative.   Neurological: Negative.   Hematological: Negative.   Psychiatric/Behavioral: Negative.     Exam:   BP 116/70 (BP Location: Left Arm, Patient Position: Sitting, Cuff Size: Normal)   Pulse (!) 110   Ht 5\' 4"  (1.626 m)   Wt 227 lb (103 kg)   LMP 04/26/2020   SpO2 99%   BMI 38.96 kg/m   Weight change: @WEIGHTCHANGE @ Height:   Height: 5\' 4"  (162.6 cm)   Ht Readings from Last 3 Encounters:  07/13/20 5\' 4"  (1.626 m)  01/04/19 5\' 4"  (1.626 m)  01/02/19 5\' 4"  (1.626 m)    General appearance: alert, cooperative and appears stated age Head: Normocephalic, without obvious abnormality, atraumatic Neck: no adenopathy, supple, symmetrical, trachea midline and thyroid normal to inspection and palpation Lungs: clear to auscultation bilaterally Cardiovascular: regular rate and rhythm Breasts: normal appearance, no masses or tenderness Abdomen: soft, non-tender; non distended,  no masses,  no organomegaly Extremities: extremities normal, atraumatic, no cyanosis or edema Skin: Skin color, texture, turgor normal. No rashes or lesions Lymph nodes: Cervical, supraclavicular, and axillary nodes normal. No abnormal inguinal nodes palpated Neurologic: Grossly normal   Pelvic: External genitalia:  no lesions              Urethra:  normal appearing urethra with no masses, tenderness or lesions              Bartholins and Skenes: normal                 Vagina: normal appearing vagina with normal color and discharge, no lesions              Cervix: no lesions and IUD string 3-4 cm               Bimanual Exam:  Uterus:  normal size, contour, position, consistency, mobility, non-tender and anteverted              Adnexa: no mass, fullness, tenderness               Rectovaginal: Confirms               Anus:  normal sphincter tone, no lesions  Terence Lux chaperoned for the exam.  1. Well woman exam Discussed breast self exam Discussed calcium and vit D intake No pap this year  2. General counseling and advice on female contraception IUD due for removal in 6/22 She is interested in OCP's, no contraindications, risks reviewed She will start one month prior to IUD removal, in the past she had mood changes with the pill. Will f/u in 6/22 - norethindrone-ethinyl estradiol (LOESTRIN FE) 1-20 MG-MCG tablet; Take 1 tablet by mouth daily. Start one month  prior to IUD removal.  Dispense: 84 tablet; Refill: 0  3. IUD check up Due for removal in 6/22

## 2020-10-08 ENCOUNTER — Other Ambulatory Visit: Payer: Self-pay | Admitting: Obstetrics and Gynecology

## 2020-10-08 DIAGNOSIS — Z3009 Encounter for other general counseling and advice on contraception: Secondary | ICD-10-CM

## 2020-10-08 NOTE — Telephone Encounter (Signed)
Per note pills 1 month prior to IUD removal. IUD removal scheduled on 11/22/20. I called patient to see if she needed refill or was this the pharmacy requesting Rx.  Patient voicemail is full.

## 2020-10-08 NOTE — Telephone Encounter (Signed)
Per Lang Snow, RMA Patient called back and states that the pharmacy sent the request she is aware of the [facility] instructions that she suppose to take 1 month before IUD removal and states that its too soon.    Rx denied, patient does not need refill and aware how to take mediation.

## 2020-11-21 ENCOUNTER — Telehealth: Payer: Self-pay

## 2020-11-21 NOTE — Telephone Encounter (Signed)
Patient called in voice mail stating she was having IUD removed tomorrow and had some questions. I called her back but her voice mailbox is full and I could not leave a message.

## 2020-11-22 ENCOUNTER — Other Ambulatory Visit: Payer: Self-pay

## 2020-11-22 ENCOUNTER — Encounter: Payer: Self-pay | Admitting: Obstetrics and Gynecology

## 2020-11-22 ENCOUNTER — Ambulatory Visit: Payer: BC Managed Care – PPO | Admitting: Obstetrics and Gynecology

## 2020-11-22 ENCOUNTER — Telehealth: Payer: Self-pay

## 2020-11-22 VITALS — BP 122/74 | HR 110 | Ht 64.0 in | Wt 229.0 lb

## 2020-11-22 DIAGNOSIS — Z30432 Encounter for removal of intrauterine contraceptive device: Secondary | ICD-10-CM

## 2020-11-22 DIAGNOSIS — Z3009 Encounter for other general counseling and advice on contraception: Secondary | ICD-10-CM

## 2020-11-22 NOTE — Telephone Encounter (Signed)
Patient to have IUD removed at 2:30pm.    She called to ask questions.  #1 She was prescribed bcp and instructed to start it one month prior to IUD removal. She did not do that because she wanted to check with her mental health provider to see if okay with her meds.Marlana Salvage did that yesterday and was told ok to start bcp.  #2 She asked when she will start bcp?  She said she is willing to abstain for a month if needed.

## 2020-11-22 NOTE — Progress Notes (Signed)
GYNECOLOGY  VISIT   HPI: 30 y.o.   Married White or Caucasian Not Hispanic or Latino  female   G0P0000 with No LMP recorded. (Menstrual status: IUD).   here for  IUD removal. She has a kyleena IUD, inserted in 6/17. She was given a script for OCP's in 2/22, hasn't started it yet. She was working on getting her mental health in a better spot prior to starting OCP's. She was started on Zoloft which is helping. Psychiatrist was planning on weaning her off of the Depakote at some point, not yet. She would like to have a baby, but not for at least one year.   GYNECOLOGIC HISTORY: No LMP recorded. (Menstrual status: IUD). Contraception:IUD Menopausal hormone therapy: none         OB History     Gravida  0   Para  0   Term  0   Preterm  0   AB  0   Living  0      SAB  0   IAB  0   Ectopic  0   Multiple  0   Live Births                 Patient Active Problem List   Diagnosis Date Noted   Irritable bowel syndrome with diarrhea 07/13/2020   Fatty liver 07/13/2020   Diarrhea 07/13/2020   Acute anal fissure 07/13/2020   Abnormal weight gain 29/92/4268   Nonalcoholic steatohepatitis (NASH) 06/26/2020   Environmental and seasonal allergies 01/11/2018   Anxiety 01/23/2017   Migraine 01/23/2017   Vitamin D deficiency 03/27/2015   Bipolar I disorder ([place])    Bipolar I disorder, most recent episode (or current) depressed, in partial or unspecified remission 02/04/2011    Past Medical History:  Diagnosis Date   Asthma    exercise induced   Bipolar 1 disorder ([place])    Concussion 09/2012   Depression    IBS (irritable bowel syndrome)     Past Surgical History:  Procedure Laterality Date   APPENDECTOMY  age 69   skyla insertion  03-30-13   TYMPANOSTOMY TUBE PLACEMENT     WISDOM TOOTH EXTRACTION      Current Outpatient Medications  Medication Sig Dispense Refill   ALPRAZolam (XANAX) 0.5 MG tablet Take by mouth.     cetirizine (ZYRTEC) 10 MG tablet Take by  mouth.     divalproex (DEPAKOTE) 500 MG DR tablet Take by mouth. Take 500mg  one day and 1,000 the next day, rotating     levonorgestrel (MIRENA) 20 MCG/24HR IUD by Intrauterine route.     norethindrone-ethinyl estradiol (LOESTRIN FE) 1-20 MG-MCG tablet Take 1 tablet by mouth daily. Start one month prior to IUD removal. 84 tablet 0   sertraline (ZOLOFT) 50 MG tablet Take 100 mg by mouth.     No current facility-administered medications for this visit.     ALLERGIES: Azithromycin, Iodine, Sulfa antibiotics, Prochlorperazine, Abilify [aripiprazole], Buspar [buspirone], Codeine, Cortizone-10 [hydrocortisone], and Lamictal [lamotrigine]  Family History  Problem Relation Age of Onset   Thyroid disease Mother    Pancreatic cancer Maternal Grandmother    Lung cancer Maternal Grandmother    Liver cancer Maternal Grandmother    Hypertension Maternal Grandmother    Mitral valve prolapse Maternal Grandmother    Heart disease Paternal Grandmother    Hypertension Paternal Grandfather    Parkinson's disease Paternal Grandfather    Alzheimer's disease Paternal Grandfather     Social History   Socioeconomic History  Marital status: Married    Spouse name: Not on file   Number of children: Not on file   Years of education: Not on file   Highest education level: Not on file  Occupational History   Not on file  Tobacco Use   Smoking status: Every Day    Pack years: 0.00    Types: Cigarettes    Last attempt to quit: 11/16/2016    Years since quitting: 4.0   Smokeless tobacco: Never  Vaping Use   Vaping Use: Never used  Substance and Sexual Activity   Alcohol use: No    Alcohol/week: 0.0 standard drinks   Drug use: No   Sexual activity: Yes    Partners: Male    Birth control/protection: I.U.D.    Comment: Kyleena placed 11/16/15  Other Topics Concern   Not on file  Social History Narrative   Not on file   Social Determinants of Health   Financial Resource Strain: Not on file  Food  Insecurity: Not on file  Transportation Needs: Not on file  Physical Activity: Not on file  Stress: Not on file  Social Connections: Not on file  Intimate Partner Violence: Not on file    ROS  PHYSICAL EXAMINATION:    BP 122/74   Pulse (!) 110   Ht 5\' 4"  (1.626 m)   Wt 229 lb (103.9 kg)   SpO2 97%   BMI 39.31 kg/m     General appearance: alert, cooperative and appears stated age  Pelvic: External genitalia:  no lesions              Urethra:  normal appearing urethra with no masses, tenderness or lesions              Bartholins and Skenes: normal                 Vagina: normal appearing vagina with normal color and discharge, no lesions              Cervix: no lesions and IUD strings 3-4 cm. IUD removed with ringed forceps               Chaperone was present for exam.  1. Encounter for IUD removal IUD removed  2. General counseling and advice on female contraception The patient is not sure what she wants to do for contraception. She has struggled getting her depression under control. She has a script for OCP's, but was worried how they would effect her mood so hasn't started them yet. -We discussed that it is possible she is having mood changes from the [facility] IUD -We discussed the option of pulling the IUD, seeing how she feels, she would abstain or use condoms during this time -We discussed the option of starting the pill now or after a few weeks to a month off the [place] to not be confused as to how coming off the [place] vs starting OCP's effects her mood.  -We discussed that OCP's can decrease the serum concentration of Depakote, she would need to discuss this with her Psychiatrist -We discussed the option for inserting another kyleena IUD -She wants to see how she feels off of the [facility], if she decides to start OCP's we discussed that she needs back up birth control for one week after starting OCP's -She will call if she wants another IUD  In addition to the removal  of the IUD, ~20 minutes was spent in counseling about different options for contraception

## 2020-11-22 NOTE — Telephone Encounter (Signed)
Dr.Jertson verbally that okay for the below. Patient will need to just back up contraception x 1 month. Patient aware.

## 2021-01-10 ENCOUNTER — Ambulatory Visit: Payer: BC Managed Care – PPO | Admitting: Obstetrics and Gynecology

## 2021-01-10 ENCOUNTER — Other Ambulatory Visit: Payer: Self-pay

## 2021-01-10 ENCOUNTER — Encounter: Payer: Self-pay | Admitting: Obstetrics and Gynecology

## 2021-01-10 VITALS — BP 110/64 | HR 101 | Ht 64.0 in | Wt 228.0 lb

## 2021-01-10 DIAGNOSIS — N9089 Other specified noninflammatory disorders of vulva and perineum: Secondary | ICD-10-CM | POA: Diagnosis not present

## 2021-01-10 NOTE — Progress Notes (Signed)
GYNECOLOGY  VISIT   HPI: 30 y.o.   Married White or Caucasian Not Hispanic or Latino  female   G0P0000 with Patient's last menstrual period was 11/22/2020.   here for a hard knot to the right of her vulva. She says that it has been there for about a week and a half. Tender, can't see it, it's under the skin. No erythema.   GYNECOLOGIC HISTORY: Patient's last menstrual period was 11/22/2020. Contraception: none Had IUD removed has not had a period.  Menopausal hormone therapy: NA        OB History     Gravida  0   Para  0   Term  0   Preterm  0   AB  0   Living  0      SAB  0   IAB  0   Ectopic  0   Multiple  0   Live Births                 Patient Active Problem List   Diagnosis Date Noted   Irritable bowel syndrome with diarrhea 07/13/2020   Fatty liver 07/13/2020   Diarrhea 07/13/2020   Acute anal fissure 07/13/2020   Abnormal weight gain [id number]   Nonalcoholic steatohepatitis (NASH) 06/26/2020   Environmental and seasonal allergies 01/11/2018   Anxiety 01/23/2017   Migraine 01/23/2017   Vitamin D deficiency 03/27/2015   Bipolar I disorder ([place])    Bipolar I disorder, most recent episode (or current) depressed, in partial or unspecified remission 02/04/2011    Past Medical History:  Diagnosis Date   Asthma    exercise induced   Bipolar 1 disorder ([place])    Concussion 09/2012   Depression    IBS (irritable bowel syndrome)     Past Surgical History:  Procedure Laterality Date   APPENDECTOMY  age 10   skyla insertion  03-30-13   TYMPANOSTOMY TUBE PLACEMENT     WISDOM TOOTH EXTRACTION      Current Outpatient Medications  Medication Sig Dispense Refill   cetirizine (ZYRTEC) 10 MG tablet Take by mouth.     divalproex (DEPAKOTE) 500 MG DR tablet Take by mouth. Take '500mg'$  one day and 1,000 the next day, rotating     sertraline (ZOLOFT) 50 MG tablet Take 100 mg by mouth.     norethindrone-ethinyl estradiol (LOESTRIN FE) 1-20 MG-MCG tablet  Take 1 tablet by mouth daily. Start one month prior to IUD removal. (Patient not taking: Reported on 01/10/2021) 84 tablet 0   No current facility-administered medications for this visit.     ALLERGIES: Azithromycin, Iodine, Sulfa antibiotics, Prochlorperazine, Abilify [aripiprazole], Buspar [buspirone], Codeine, Cortizone-10 [hydrocortisone], and Lamictal [lamotrigine]  Family History  Problem Relation Age of Onset   Thyroid disease Mother    Pancreatic cancer Maternal Grandmother    Lung cancer Maternal Grandmother    Liver cancer Maternal Grandmother    Hypertension Maternal Grandmother    Mitral valve prolapse Maternal Grandmother    Heart disease Paternal Grandmother    Hypertension Paternal Grandfather    Parkinson's disease Paternal Grandfather    Alzheimer's disease Paternal Grandfather     Social History   Socioeconomic History   Marital status: Married    Spouse name: Not on file   Number of children: Not on file   Years of education: Not on file   Highest education level: Not on file  Occupational History   Not on file  Tobacco Use   Smoking status: Every  Day    Types: Cigarettes    Last attempt to quit: 11/16/2016    Years since quitting: 4.1   Smokeless tobacco: Never  Vaping Use   Vaping Use: Never used  Substance and Sexual Activity   Alcohol use: No    Alcohol/week: 0.0 standard drinks   Drug use: No   Sexual activity: Yes    Partners: Male    Birth control/protection: I.U.D.    Comment: Kyleena placed 11/16/15  Other Topics Concern   Not on file  Social History Narrative   Not on file   Social Determinants of Health   Financial Resource Strain: Not on file  Food Insecurity: Not on file  Transportation Needs: Not on file  Physical Activity: Not on file  Stress: Not on file  Social Connections: Not on file  Intimate Partner Violence: Not on file    Review of Systems  All other systems reviewed and are negative.  PHYSICAL EXAMINATION:     BP 110/64   Pulse (!) 101   Ht '5\' 4"'$  (1.626 m)   Wt 228 lb (103.4 kg)   LMP 11/22/2020   SpO2 99%   BMI 39.14 kg/m     General appearance: alert, cooperative and appears stated age  Pelvic: External genitalia:  there is a 2 mm slightly erythematous papule on the lateral right labia minora, slightly tender. 6 mm epidermoid cyst on the right labia majora              Urethra:  normal appearing urethra with no masses, tenderness or lesions              Bartholins and Skenes: normal                  Chaperone was present for exam.  1. Vulvar irritation Small irritated cyst vs pimple.  Use warm compress and hot soaks, call if worsening.

## 2021-01-17 ENCOUNTER — Ambulatory Visit: Payer: BC Managed Care – PPO | Admitting: Nurse Practitioner

## 2021-03-02 ENCOUNTER — Telehealth: Payer: Self-pay | Admitting: Obstetrics and Gynecology

## 2021-03-02 NOTE — Telephone Encounter (Signed)
After hours phone call from patient.   Calling with heavy bleeding.  Had her IUD removed in July.  She has not had menses in years. She is now having her second period, one time, which started 02/23/21.  Bleeding began and spotting and had progressed to heavy bleeding using an overnight pad and changing every 2 hours for the last 2 - 3 days.  She denies dizziness or lightheadedness.   Taking Tylenol and using a heating pad.   She and her husband are going to try for pregnancy but have not begun yet.   I recommended Advil 800 mg every 8 hours.  If bleeding not improved or if she develops dizziness, lightheadedness, or fatigue, I recommend evaluation at urgent care or ER.  I invited her to call back if needed.

## 2022-03-18 ENCOUNTER — Ambulatory Visit: Payer: BC Managed Care – PPO | Admitting: Nurse Practitioner

## 2022-03-18 ENCOUNTER — Encounter: Payer: Self-pay | Admitting: Nurse Practitioner

## 2022-03-18 VITALS — BP 94/62 | HR 87

## 2022-03-18 DIAGNOSIS — R635 Abnormal weight gain: Secondary | ICD-10-CM | POA: Diagnosis not present

## 2022-03-18 DIAGNOSIS — N911 Secondary amenorrhea: Secondary | ICD-10-CM

## 2022-03-18 DIAGNOSIS — L680 Hirsutism: Secondary | ICD-10-CM | POA: Diagnosis not present

## 2022-03-18 DIAGNOSIS — Z833 Family history of diabetes mellitus: Secondary | ICD-10-CM

## 2022-03-18 NOTE — Progress Notes (Signed)
   Acute Office Visit  Subjective:    Patient ID: Emily Chavez, female    DOB: 07/06/1990, 31 y.o.   MRN: [id number]   HPI 31 y.o. presents today for amenorrhea. LMP in August. Had IUD removed 11/2020, cycles were regular until August. Her and her husband are considering trying to conceive. Saw PCP 03/10/2022 - negative UPT and serum hcg, normal FSH, LH, estradiol, TSH, and prolactin. Pelvic ultrasound ordered to rule out ectopic but was canceled due to negative Hcg levels and no pain. H/O irregular cycles when she was younger and was started on COCs as a teenager.  Has had increased personal stress with buying and renovating home this year, new job with promotion, and a few weeks ago husband had seizure. Has had trouble with weight but this has occurred since starting Depakote years ago but does feel this is getting worse. Complains of hair along chin/jaw line.   Review of Systems  Constitutional: Negative.   Genitourinary:  Positive for menstrual problem.  Skin:        Hair along chin and jawline       Objective:    Physical Exam Constitutional:      Appearance: Normal appearance.  Genitourinary:    General: Normal vulva.     Vagina: Normal.     Cervix: Normal.     Uterus: Normal.      BP 94/62   Pulse 87   SpO2 98%  Wt Readings from Last 3 Encounters:  01/10/21 228 lb (103.4 kg)  11/22/20 229 lb (103.9 kg)  07/13/20 227 lb (103 kg)        Patient informed chaperone available to be present for breast and/or pelvic exam. Patient has requested no chaperone to be present. Patient has been advised what will be completed during breast and pelvic exam.   Assessment & Plan:   Problem List Items Addressed This Visit       Other   Abnormal weight gain   Relevant Orders   Hemoglobin A1c   Other Visit Diagnoses     Secondary amenorrhea    -  Primary   Hirsutism       Relevant Orders   Testos,Total,Free and SHBG (Female)   Family history of diabetes mellitus        Relevant Orders   Hemoglobin A1c      Plan: A1c and testosterone panel today. Discussed possibility of PCOS and management with Metformin if appropriate. Plans to try to conceive soon. Stress may also be a factor in menstrual changes. Recommend self-care practices.      Tamela Gammon DNP, 12:10 PM 03/18/2022

## 2022-03-23 LAB — HEMOGLOBIN A1C
Hgb A1c MFr Bld: 5.7 % of total Hgb — ABNORMAL HIGH (ref <5.7)
Mean Plasma Glucose: 117 mg/dL
eAG (mmol/L): 6.5 mmol/L

## 2022-03-23 LAB — TESTOS,TOTAL,FREE AND SHBG (FEMALE)
Free Testosterone: 9.2 pg/mL — ABNORMAL HIGH (ref 0.1–6.4)
Sex Hormone Binding: 16.6 nmol/L — ABNORMAL LOW (ref 17–124)
Testosterone, Total, LC-MS-MS: 48 ng/dL — ABNORMAL HIGH (ref 2–45)

## 2022-03-24 ENCOUNTER — Other Ambulatory Visit: Payer: Self-pay | Admitting: Nurse Practitioner

## 2022-03-24 DIAGNOSIS — E282 Polycystic ovarian syndrome: Secondary | ICD-10-CM

## 2022-03-24 DIAGNOSIS — R7303 Prediabetes: Secondary | ICD-10-CM

## 2022-03-24 MED ORDER — METFORMIN HCL 500 MG PO TABS: 500.0000 mg | ORAL_TABLET | Freq: Two times a day (BID) | ORAL | 2 refills | Status: DC

## 2022-04-03 ENCOUNTER — Telehealth: Payer: Self-pay | Admitting: *Deleted

## 2022-04-03 NOTE — Telephone Encounter (Signed)
Yes, 1/2 tablet to start is fine. Once tolerating she should increase to full tablet. GI side effects are common and usually improve after 1-2 weeks of starting or increasing dose. If she does not tolerate this she can work on diet and exercise for pre-diabetes management but cycles will likely not improve without Metformin.

## 2022-04-03 NOTE — Telephone Encounter (Signed)
Patient informed. 

## 2022-04-03 NOTE — Telephone Encounter (Signed)
Patient called was prescribed metformin 500 mg tablet on 03/24/22. Patient started pills yesterday she is aware it could cause some GI issues. Patient c/o terrible nausea she couldn't go to work, has IBS as well. She asked can she take at 1/2 pill daily instead of 1 pill? And if she does she how long does she take this before the increase? If she can't take 1/2 tablet daily,what else could she do, because 1 pill daily is going to be hard. Please advise

## 2022-06-26 ENCOUNTER — Other Ambulatory Visit: Payer: Self-pay | Admitting: Nurse Practitioner

## 2022-06-26 DIAGNOSIS — R7303 Prediabetes: Secondary | ICD-10-CM

## 2022-06-26 DIAGNOSIS — E282 Polycystic ovarian syndrome: Secondary | ICD-10-CM

## 2022-06-26 NOTE — Telephone Encounter (Signed)
RF request received for Metformin '500mg'$ . Last problem ov 03/18/22.  Last AEX 07/13/20.  RF sent for Metformin '500mg'$  #60.  Patient needs AEX for further refills.

## 2022-07-10 ENCOUNTER — Other Ambulatory Visit: Payer: Self-pay | Admitting: Nurse Practitioner

## 2022-07-10 DIAGNOSIS — R7303 Prediabetes: Secondary | ICD-10-CM

## 2022-07-10 DIAGNOSIS — E282 Polycystic ovarian syndrome: Secondary | ICD-10-CM

## 2022-07-10 NOTE — Telephone Encounter (Signed)
Pharmacy comment: REQUEST FOR 90 DAYS PRESCRIPTION.

## 2022-07-30 NOTE — Progress Notes (Deleted)
32 y.o. G0P0000 Married White or Caucasian Not Hispanic or Latino female here for annual exam.      No LMP recorded.          Sexually active: {yes no:314532}  The current method of family planning is {contraception:315051}.    Exercising: {yes no:314532}  {types:19826} Smoker:  {YES P5382123  Health Maintenance: Pap: 07/16/18 Neg             01/23/17 Neg:Neg HR HPV History of abnormal Pap:  no MMG:  02/01/16 Left Breast US - BIRADS 2 benign Colonoscopy: 05/25/15 f/u at 50 TDaP:  07/13/20 Gardasil: complete per patient    reports that she has been smoking cigarettes. She has never used smokeless tobacco. She reports that she does not drink alcohol and does not use drugs.  Past Medical History:  Diagnosis Date   Asthma    exercise induced   Bipolar 1 disorder ([place])    Concussion 09/2012   Depression    IBS (irritable bowel syndrome)     Past Surgical History:  Procedure Laterality Date   APPENDECTOMY  age 26   skyla insertion  03-30-13   TYMPANOSTOMY TUBE PLACEMENT     WISDOM TOOTH EXTRACTION      Current Outpatient Medications  Medication Sig Dispense Refill   albuterol (VENTOLIN HFA) 108 (90 Base) MCG/ACT inhaler INHALE TWO PUFFS EVERY FOUR TO SIX HOURS AS NEEDED.     cetirizine (ZYRTEC) 10 MG tablet Take by mouth.     divalproex (DEPAKOTE) 500 MG DR tablet Take by mouth. Take '500mg'$  one day and 1,000 the next day, rotating     metFORMIN (GLUCOPHAGE) 500 MG tablet TAKE 1 TABLET (500 MG TOTAL) BY MOUTH 2 (TWO) TIMES DAILY WITH A MEAL. MAY START ONCE DAILY WITH BREAKFAST AND INCREASE TO TWICE DAILY WITH MEALS AS TOLERATED PHARMACY NOT CONTRACTED 180 tablet 1   sertraline (ZOLOFT) 100 MG tablet Take by mouth.     No current facility-administered medications for this visit.    Family History  Problem Relation Age of Onset   Thyroid disease Mother    Pancreatic cancer Maternal Grandmother    Lung cancer Maternal Grandmother    Liver cancer Maternal Grandmother     Hypertension Maternal Grandmother    Mitral valve prolapse Maternal Grandmother    Heart disease Paternal Grandmother    Hypertension Paternal Grandfather    Parkinson's disease Paternal Grandfather    Alzheimer's disease Paternal Grandfather     Review of Systems  Exam:   There were no vitals taken for this visit.  Weight change: '@WEIGHTCHANGE'$ @ Height:      Ht Readings from Last 3 Encounters:  01/10/21 '5\' 4"'$  (1.626 m)  11/22/20 '5\' 4"'$  (1.626 m)  07/13/20 '5\' 4"'$  (1.626 m)    General appearance: alert, cooperative and appears stated age Head: Normocephalic, without obvious abnormality, atraumatic Neck: no adenopathy, supple, symmetrical, trachea midline and thyroid {CHL AMB PHY EX THYROID NORM [phone]::"normal to inspection and palpation"} Lungs: clear to auscultation bilaterally Cardiovascular: regular rate and rhythm Breasts: {Exam; breast:13139::"normal appearance, no masses or tenderness"} Abdomen: soft, non-tender; non distended,  no masses,  no organomegaly Extremities: extremities normal, atraumatic, no cyanosis or edema Skin: Skin color, texture, turgor normal. No rashes or lesions Lymph nodes: Cervical, supraclavicular, and axillary nodes normal. No abnormal inguinal nodes palpated Neurologic: Grossly normal   Pelvic: External genitalia:  no lesions              Urethra:  normal appearing urethra  with no masses, tenderness or lesions              Bartholins and Skenes: normal                 Vagina: normal appearing vagina with normal color and discharge, no lesions              Cervix: {CHL AMB PHY EX CERVIX NORM DEFAULT:[phone]::"no lesions"}               Bimanual Exam:  Uterus:  {CHL AMB PHY EX UTERUS NORM DEFAULT:[phone]::"normal size, contour, position, consistency, mobility, non-tender"}              Adnexa: {CHL AMB PHY EX ADNEXA NO MASS DEFAULT:[phone]::"no mass, fullness, tenderness"}               Rectovaginal: Confirms               Anus:   normal sphincter tone, no lesions  *** chaperoned for the exam.  A:  Well Woman with normal exam  P:

## 2022-08-12 ENCOUNTER — Ambulatory Visit: Payer: BC Managed Care – PPO | Admitting: Obstetrics and Gynecology

## 2024-01-08 ENCOUNTER — Other Ambulatory Visit: Payer: Self-pay | Admitting: Family

## 2024-01-08 DIAGNOSIS — N6311 Unspecified lump in the right breast, upper outer quadrant: Secondary | ICD-10-CM

## 2024-01-29 ENCOUNTER — Encounter

## 2024-01-29 ENCOUNTER — Other Ambulatory Visit
